# Patient Record
Sex: Female | Born: 1950 | Race: White | Hispanic: No | Marital: Married | Smoking: Never smoker
Health system: Southern US, Community
[De-identification: ages and names within clinical notes are randomized; demographics above are authoritative.]

## PROBLEM LIST (undated history)

## (undated) DIAGNOSIS — J45909 Unspecified asthma, uncomplicated: Secondary | ICD-10-CM

## (undated) DIAGNOSIS — K9041 Non-celiac gluten sensitivity: Secondary | ICD-10-CM

## (undated) DIAGNOSIS — K224 Dyskinesia of esophagus: Secondary | ICD-10-CM

## (undated) DIAGNOSIS — Z860100 Personal history of colon polyps, unspecified: Secondary | ICD-10-CM

## (undated) DIAGNOSIS — Z87828 Personal history of other (healed) physical injury and trauma: Secondary | ICD-10-CM

## (undated) DIAGNOSIS — C50919 Malignant neoplasm of unspecified site of unspecified female breast: Secondary | ICD-10-CM

## (undated) DIAGNOSIS — E041 Nontoxic single thyroid nodule: Secondary | ICD-10-CM

## (undated) DIAGNOSIS — Z87442 Personal history of urinary calculi: Secondary | ICD-10-CM

## (undated) DIAGNOSIS — K219 Gastro-esophageal reflux disease without esophagitis: Secondary | ICD-10-CM

## (undated) HISTORY — DX: Personal history of urinary calculi: Z87.442

## (undated) HISTORY — PX: CHOLECYSTECTOMY: SHX55

## (undated) HISTORY — DX: Dyskinesia of esophagus: K22.4

## (undated) HISTORY — DX: Unspecified asthma, uncomplicated: J45.909

## (undated) HISTORY — DX: Malignant neoplasm of unspecified site of unspecified female breast: C50.919

## (undated) HISTORY — DX: Nontoxic single thyroid nodule: E04.1

## (undated) HISTORY — DX: Gastro-esophageal reflux disease without esophagitis: K21.9

## (undated) HISTORY — DX: Personal history of other (healed) physical injury and trauma: Z87.828

## (undated) HISTORY — DX: Non-celiac gluten sensitivity: K90.41

## (undated) HISTORY — DX: Personal history of colon polyps, unspecified: Z86.0100

## (undated) HISTORY — PX: BREAST LUMPECTOMY WITH SENTINEL LYMPH NODE BIOPSY: SHX5597

## (undated) HISTORY — PX: SKIN GRAFT: SHX250

---

## 2015-01-09 DIAGNOSIS — Z1231 Encounter for screening mammogram for malignant neoplasm of breast: Secondary | ICD-10-CM | POA: Diagnosis not present

## 2015-01-09 DIAGNOSIS — Z1211 Encounter for screening for malignant neoplasm of colon: Secondary | ICD-10-CM | POA: Diagnosis not present

## 2015-01-09 DIAGNOSIS — Z0000 Encounter for general adult medical examination without abnormal findings: Secondary | ICD-10-CM | POA: Diagnosis not present

## 2015-01-09 DIAGNOSIS — R413 Other amnesia: Secondary | ICD-10-CM | POA: Diagnosis not present

## 2015-01-09 DIAGNOSIS — M859 Disorder of bone density and structure, unspecified: Secondary | ICD-10-CM | POA: Diagnosis not present

## 2015-01-09 DIAGNOSIS — Z136 Encounter for screening for cardiovascular disorders: Secondary | ICD-10-CM | POA: Diagnosis not present

## 2015-01-09 DIAGNOSIS — Z124 Encounter for screening for malignant neoplasm of cervix: Secondary | ICD-10-CM | POA: Diagnosis not present

## 2015-01-12 DIAGNOSIS — Z1211 Encounter for screening for malignant neoplasm of colon: Secondary | ICD-10-CM | POA: Diagnosis not present

## 2015-02-03 DIAGNOSIS — Z1231 Encounter for screening mammogram for malignant neoplasm of breast: Secondary | ICD-10-CM | POA: Diagnosis not present

## 2015-02-08 DIAGNOSIS — R4789 Other speech disturbances: Secondary | ICD-10-CM | POA: Diagnosis not present

## 2015-02-08 DIAGNOSIS — I69954 Hemiplegia and hemiparesis following unspecified cerebrovascular disease affecting left non-dominant side: Secondary | ICD-10-CM | POA: Diagnosis not present

## 2015-02-08 DIAGNOSIS — R412 Retrograde amnesia: Secondary | ICD-10-CM | POA: Diagnosis not present

## 2015-02-10 DIAGNOSIS — R4781 Slurred speech: Secondary | ICD-10-CM | POA: Diagnosis not present

## 2015-02-10 DIAGNOSIS — M8580 Other specified disorders of bone density and structure, unspecified site: Secondary | ICD-10-CM | POA: Diagnosis not present

## 2015-02-10 DIAGNOSIS — R55 Syncope and collapse: Secondary | ICD-10-CM | POA: Diagnosis not present

## 2015-02-10 DIAGNOSIS — I69954 Hemiplegia and hemiparesis following unspecified cerebrovascular disease affecting left non-dominant side: Secondary | ICD-10-CM | POA: Diagnosis not present

## 2015-02-10 DIAGNOSIS — I517 Cardiomegaly: Secondary | ICD-10-CM | POA: Diagnosis not present

## 2015-02-10 DIAGNOSIS — Z0389 Encounter for observation for other suspected diseases and conditions ruled out: Secondary | ICD-10-CM | POA: Diagnosis not present

## 2015-02-10 DIAGNOSIS — M8589 Other specified disorders of bone density and structure, multiple sites: Secondary | ICD-10-CM | POA: Diagnosis not present

## 2015-02-10 DIAGNOSIS — R4701 Aphasia: Secondary | ICD-10-CM | POA: Diagnosis not present

## 2015-02-10 DIAGNOSIS — Z13820 Encounter for screening for osteoporosis: Secondary | ICD-10-CM | POA: Diagnosis not present

## 2015-02-16 DIAGNOSIS — R42 Dizziness and giddiness: Secondary | ICD-10-CM | POA: Diagnosis not present

## 2015-02-16 DIAGNOSIS — R29898 Other symptoms and signs involving the musculoskeletal system: Secondary | ICD-10-CM | POA: Diagnosis not present

## 2015-02-16 DIAGNOSIS — Z86010 Personal history of colonic polyps: Secondary | ICD-10-CM | POA: Diagnosis not present

## 2015-02-16 DIAGNOSIS — R412 Retrograde amnesia: Secondary | ICD-10-CM | POA: Diagnosis not present

## 2015-02-16 DIAGNOSIS — R479 Unspecified speech disturbances: Secondary | ICD-10-CM | POA: Diagnosis not present

## 2015-02-16 DIAGNOSIS — Z1211 Encounter for screening for malignant neoplasm of colon: Secondary | ICD-10-CM | POA: Diagnosis not present

## 2015-02-21 DIAGNOSIS — R29898 Other symptoms and signs involving the musculoskeletal system: Secondary | ICD-10-CM | POA: Diagnosis not present

## 2015-02-21 DIAGNOSIS — S3992XA Unspecified injury of lower back, initial encounter: Secondary | ICD-10-CM | POA: Diagnosis not present

## 2015-02-21 DIAGNOSIS — M6281 Muscle weakness (generalized): Secondary | ICD-10-CM | POA: Diagnosis not present

## 2015-02-21 DIAGNOSIS — R2681 Unsteadiness on feet: Secondary | ICD-10-CM | POA: Diagnosis not present

## 2015-02-21 DIAGNOSIS — R202 Paresthesia of skin: Secondary | ICD-10-CM | POA: Diagnosis not present

## 2015-02-21 DIAGNOSIS — M545 Low back pain: Secondary | ICD-10-CM | POA: Diagnosis not present

## 2015-02-24 DIAGNOSIS — E0789 Other specified disorders of thyroid: Secondary | ICD-10-CM | POA: Diagnosis not present

## 2015-02-24 DIAGNOSIS — E042 Nontoxic multinodular goiter: Secondary | ICD-10-CM | POA: Diagnosis not present

## 2015-03-09 DIAGNOSIS — E041 Nontoxic single thyroid nodule: Secondary | ICD-10-CM | POA: Diagnosis not present

## 2015-03-20 DIAGNOSIS — Z9049 Acquired absence of other specified parts of digestive tract: Secondary | ICD-10-CM | POA: Diagnosis not present

## 2015-03-20 DIAGNOSIS — Z800 Family history of malignant neoplasm of digestive organs: Secondary | ICD-10-CM | POA: Diagnosis not present

## 2015-03-20 DIAGNOSIS — K219 Gastro-esophageal reflux disease without esophagitis: Secondary | ICD-10-CM | POA: Diagnosis not present

## 2015-03-20 DIAGNOSIS — K648 Other hemorrhoids: Secondary | ICD-10-CM | POA: Diagnosis not present

## 2015-03-20 DIAGNOSIS — K621 Rectal polyp: Secondary | ICD-10-CM | POA: Diagnosis not present

## 2015-03-20 DIAGNOSIS — K5730 Diverticulosis of large intestine without perforation or abscess without bleeding: Secondary | ICD-10-CM | POA: Diagnosis not present

## 2015-03-20 DIAGNOSIS — Z1211 Encounter for screening for malignant neoplasm of colon: Secondary | ICD-10-CM | POA: Diagnosis not present

## 2015-03-20 DIAGNOSIS — Z8669 Personal history of other diseases of the nervous system and sense organs: Secondary | ICD-10-CM | POA: Diagnosis not present

## 2015-03-20 DIAGNOSIS — J45909 Unspecified asthma, uncomplicated: Secondary | ICD-10-CM | POA: Diagnosis not present

## 2015-03-20 DIAGNOSIS — D128 Benign neoplasm of rectum: Secondary | ICD-10-CM | POA: Diagnosis not present

## 2015-03-20 DIAGNOSIS — J42 Unspecified chronic bronchitis: Secondary | ICD-10-CM | POA: Diagnosis not present

## 2015-03-20 DIAGNOSIS — Z86010 Personal history of colonic polyps: Secondary | ICD-10-CM | POA: Diagnosis not present

## 2015-03-20 DIAGNOSIS — M8580 Other specified disorders of bone density and structure, unspecified site: Secondary | ICD-10-CM | POA: Diagnosis not present

## 2015-05-02 DIAGNOSIS — N3001 Acute cystitis with hematuria: Secondary | ICD-10-CM | POA: Diagnosis not present

## 2015-09-19 DIAGNOSIS — Z23 Encounter for immunization: Secondary | ICD-10-CM | POA: Diagnosis not present

## 2016-02-19 DIAGNOSIS — Z23 Encounter for immunization: Secondary | ICD-10-CM | POA: Diagnosis not present

## 2016-02-19 DIAGNOSIS — Z0000 Encounter for general adult medical examination without abnormal findings: Secondary | ICD-10-CM | POA: Diagnosis not present

## 2016-02-19 DIAGNOSIS — Z124 Encounter for screening for malignant neoplasm of cervix: Secondary | ICD-10-CM | POA: Diagnosis not present

## 2016-02-19 DIAGNOSIS — Z1231 Encounter for screening mammogram for malignant neoplasm of breast: Secondary | ICD-10-CM | POA: Diagnosis not present

## 2016-02-19 DIAGNOSIS — M8589 Other specified disorders of bone density and structure, multiple sites: Secondary | ICD-10-CM | POA: Diagnosis not present

## 2016-02-20 DIAGNOSIS — R413 Other amnesia: Secondary | ICD-10-CM | POA: Diagnosis not present

## 2016-02-20 DIAGNOSIS — Z0000 Encounter for general adult medical examination without abnormal findings: Secondary | ICD-10-CM | POA: Diagnosis not present

## 2016-02-27 DIAGNOSIS — Z1231 Encounter for screening mammogram for malignant neoplasm of breast: Secondary | ICD-10-CM | POA: Diagnosis not present

## 2016-02-27 DIAGNOSIS — R928 Other abnormal and inconclusive findings on diagnostic imaging of breast: Secondary | ICD-10-CM | POA: Diagnosis not present

## 2016-03-11 DIAGNOSIS — N6002 Solitary cyst of left breast: Secondary | ICD-10-CM | POA: Diagnosis not present

## 2016-09-02 DIAGNOSIS — J018 Other acute sinusitis: Secondary | ICD-10-CM | POA: Diagnosis not present

## 2016-10-10 DIAGNOSIS — Z23 Encounter for immunization: Secondary | ICD-10-CM | POA: Diagnosis not present

## 2017-02-20 DIAGNOSIS — J018 Other acute sinusitis: Secondary | ICD-10-CM | POA: Diagnosis not present

## 2017-03-10 DIAGNOSIS — J018 Other acute sinusitis: Secondary | ICD-10-CM | POA: Diagnosis not present

## 2017-05-13 DIAGNOSIS — M25551 Pain in right hip: Secondary | ICD-10-CM | POA: Diagnosis not present

## 2017-05-27 DIAGNOSIS — K219 Gastro-esophageal reflux disease without esophagitis: Secondary | ICD-10-CM | POA: Diagnosis not present

## 2017-05-27 DIAGNOSIS — N950 Postmenopausal bleeding: Secondary | ICD-10-CM | POA: Diagnosis not present

## 2017-06-03 DIAGNOSIS — N950 Postmenopausal bleeding: Secondary | ICD-10-CM | POA: Diagnosis not present

## 2017-07-15 DIAGNOSIS — R42 Dizziness and giddiness: Secondary | ICD-10-CM | POA: Diagnosis not present

## 2017-07-15 DIAGNOSIS — R51 Headache: Secondary | ICD-10-CM | POA: Diagnosis not present

## 2017-07-15 DIAGNOSIS — R55 Syncope and collapse: Secondary | ICD-10-CM | POA: Diagnosis not present

## 2017-07-15 DIAGNOSIS — I951 Orthostatic hypotension: Secondary | ICD-10-CM | POA: Diagnosis not present

## 2017-07-21 DIAGNOSIS — N950 Postmenopausal bleeding: Secondary | ICD-10-CM | POA: Diagnosis not present

## 2017-07-21 DIAGNOSIS — N840 Polyp of corpus uteri: Secondary | ICD-10-CM | POA: Diagnosis not present

## 2017-07-30 DIAGNOSIS — R55 Syncope and collapse: Secondary | ICD-10-CM | POA: Diagnosis not present

## 2017-07-30 DIAGNOSIS — R51 Headache: Secondary | ICD-10-CM | POA: Diagnosis not present

## 2017-08-05 DIAGNOSIS — N950 Postmenopausal bleeding: Secondary | ICD-10-CM | POA: Diagnosis not present

## 2017-08-05 DIAGNOSIS — N840 Polyp of corpus uteri: Secondary | ICD-10-CM | POA: Diagnosis not present

## 2017-08-12 DIAGNOSIS — H25813 Combined forms of age-related cataract, bilateral: Secondary | ICD-10-CM | POA: Diagnosis not present

## 2017-11-13 DIAGNOSIS — M25551 Pain in right hip: Secondary | ICD-10-CM | POA: Diagnosis not present

## 2018-02-10 DIAGNOSIS — Z79899 Other long term (current) drug therapy: Secondary | ICD-10-CM | POA: Diagnosis not present

## 2018-02-10 DIAGNOSIS — Z0000 Encounter for general adult medical examination without abnormal findings: Secondary | ICD-10-CM | POA: Diagnosis not present

## 2018-02-10 DIAGNOSIS — Z1211 Encounter for screening for malignant neoplasm of colon: Secondary | ICD-10-CM | POA: Diagnosis not present

## 2018-02-17 DIAGNOSIS — M25551 Pain in right hip: Secondary | ICD-10-CM | POA: Diagnosis not present

## 2018-03-05 DIAGNOSIS — M25551 Pain in right hip: Secondary | ICD-10-CM | POA: Diagnosis not present

## 2018-03-05 DIAGNOSIS — M25651 Stiffness of right hip, not elsewhere classified: Secondary | ICD-10-CM | POA: Diagnosis not present

## 2018-03-05 DIAGNOSIS — R2689 Other abnormalities of gait and mobility: Secondary | ICD-10-CM | POA: Diagnosis not present

## 2018-03-09 DIAGNOSIS — M25551 Pain in right hip: Secondary | ICD-10-CM | POA: Diagnosis not present

## 2018-03-09 DIAGNOSIS — R2689 Other abnormalities of gait and mobility: Secondary | ICD-10-CM | POA: Diagnosis not present

## 2018-03-09 DIAGNOSIS — M25651 Stiffness of right hip, not elsewhere classified: Secondary | ICD-10-CM | POA: Diagnosis not present

## 2018-03-12 DIAGNOSIS — R2689 Other abnormalities of gait and mobility: Secondary | ICD-10-CM | POA: Diagnosis not present

## 2018-03-12 DIAGNOSIS — M25651 Stiffness of right hip, not elsewhere classified: Secondary | ICD-10-CM | POA: Diagnosis not present

## 2018-03-12 DIAGNOSIS — M25551 Pain in right hip: Secondary | ICD-10-CM | POA: Diagnosis not present

## 2018-03-16 DIAGNOSIS — R2689 Other abnormalities of gait and mobility: Secondary | ICD-10-CM | POA: Diagnosis not present

## 2018-03-16 DIAGNOSIS — M25651 Stiffness of right hip, not elsewhere classified: Secondary | ICD-10-CM | POA: Diagnosis not present

## 2018-03-16 DIAGNOSIS — M25551 Pain in right hip: Secondary | ICD-10-CM | POA: Diagnosis not present

## 2018-03-19 DIAGNOSIS — M25651 Stiffness of right hip, not elsewhere classified: Secondary | ICD-10-CM | POA: Diagnosis not present

## 2018-03-19 DIAGNOSIS — R2689 Other abnormalities of gait and mobility: Secondary | ICD-10-CM | POA: Diagnosis not present

## 2018-03-19 DIAGNOSIS — M25551 Pain in right hip: Secondary | ICD-10-CM | POA: Diagnosis not present

## 2018-03-23 DIAGNOSIS — M25551 Pain in right hip: Secondary | ICD-10-CM | POA: Diagnosis not present

## 2018-03-23 DIAGNOSIS — M25651 Stiffness of right hip, not elsewhere classified: Secondary | ICD-10-CM | POA: Diagnosis not present

## 2018-03-23 DIAGNOSIS — R2689 Other abnormalities of gait and mobility: Secondary | ICD-10-CM | POA: Diagnosis not present

## 2018-03-31 DIAGNOSIS — R2689 Other abnormalities of gait and mobility: Secondary | ICD-10-CM | POA: Diagnosis not present

## 2018-03-31 DIAGNOSIS — M25551 Pain in right hip: Secondary | ICD-10-CM | POA: Diagnosis not present

## 2018-03-31 DIAGNOSIS — M25651 Stiffness of right hip, not elsewhere classified: Secondary | ICD-10-CM | POA: Diagnosis not present

## 2018-04-07 DIAGNOSIS — Z9181 History of falling: Secondary | ICD-10-CM | POA: Diagnosis not present

## 2018-04-07 DIAGNOSIS — Z139 Encounter for screening, unspecified: Secondary | ICD-10-CM | POA: Diagnosis not present

## 2018-04-07 DIAGNOSIS — J302 Other seasonal allergic rhinitis: Secondary | ICD-10-CM | POA: Diagnosis not present

## 2018-04-07 DIAGNOSIS — Z1331 Encounter for screening for depression: Secondary | ICD-10-CM | POA: Diagnosis not present

## 2018-04-07 DIAGNOSIS — M799 Soft tissue disorder, unspecified: Secondary | ICD-10-CM | POA: Diagnosis not present

## 2018-04-16 DIAGNOSIS — R2241 Localized swelling, mass and lump, right lower limb: Secondary | ICD-10-CM | POA: Diagnosis not present

## 2018-04-20 DIAGNOSIS — R2241 Localized swelling, mass and lump, right lower limb: Secondary | ICD-10-CM | POA: Diagnosis not present

## 2018-04-20 DIAGNOSIS — M25551 Pain in right hip: Secondary | ICD-10-CM | POA: Diagnosis not present

## 2018-04-20 DIAGNOSIS — M7061 Trochanteric bursitis, right hip: Secondary | ICD-10-CM | POA: Diagnosis not present

## 2018-05-04 DIAGNOSIS — M25651 Stiffness of right hip, not elsewhere classified: Secondary | ICD-10-CM | POA: Diagnosis not present

## 2018-05-04 DIAGNOSIS — R2689 Other abnormalities of gait and mobility: Secondary | ICD-10-CM | POA: Diagnosis not present

## 2018-05-04 DIAGNOSIS — M25551 Pain in right hip: Secondary | ICD-10-CM | POA: Diagnosis not present

## 2018-08-24 DIAGNOSIS — H02846 Edema of left eye, unspecified eyelid: Secondary | ICD-10-CM | POA: Diagnosis not present

## 2018-09-15 DIAGNOSIS — Z23 Encounter for immunization: Secondary | ICD-10-CM | POA: Diagnosis not present

## 2018-12-15 DIAGNOSIS — N390 Urinary tract infection, site not specified: Secondary | ICD-10-CM | POA: Diagnosis not present

## 2018-12-15 DIAGNOSIS — R300 Dysuria: Secondary | ICD-10-CM | POA: Diagnosis not present

## 2019-04-27 DIAGNOSIS — Z9181 History of falling: Secondary | ICD-10-CM | POA: Diagnosis not present

## 2019-04-27 DIAGNOSIS — Z139 Encounter for screening, unspecified: Secondary | ICD-10-CM | POA: Diagnosis not present

## 2019-04-27 DIAGNOSIS — N959 Unspecified menopausal and perimenopausal disorder: Secondary | ICD-10-CM | POA: Diagnosis not present

## 2019-04-27 DIAGNOSIS — Z0000 Encounter for general adult medical examination without abnormal findings: Secondary | ICD-10-CM | POA: Diagnosis not present

## 2019-04-27 DIAGNOSIS — Z1231 Encounter for screening mammogram for malignant neoplasm of breast: Secondary | ICD-10-CM | POA: Diagnosis not present

## 2019-05-18 DIAGNOSIS — K219 Gastro-esophageal reflux disease without esophagitis: Secondary | ICD-10-CM | POA: Diagnosis not present

## 2019-05-18 DIAGNOSIS — R1310 Dysphagia, unspecified: Secondary | ICD-10-CM | POA: Diagnosis not present

## 2019-05-18 DIAGNOSIS — Z79899 Other long term (current) drug therapy: Secondary | ICD-10-CM | POA: Diagnosis not present

## 2019-05-24 DIAGNOSIS — K219 Gastro-esophageal reflux disease without esophagitis: Secondary | ICD-10-CM | POA: Diagnosis not present

## 2019-05-24 DIAGNOSIS — Z79899 Other long term (current) drug therapy: Secondary | ICD-10-CM | POA: Diagnosis not present

## 2019-06-15 DIAGNOSIS — R1310 Dysphagia, unspecified: Secondary | ICD-10-CM | POA: Diagnosis not present

## 2019-06-21 DIAGNOSIS — R1310 Dysphagia, unspecified: Secondary | ICD-10-CM | POA: Diagnosis not present

## 2019-06-21 DIAGNOSIS — K219 Gastro-esophageal reflux disease without esophagitis: Secondary | ICD-10-CM | POA: Diagnosis not present

## 2019-07-15 DIAGNOSIS — K224 Dyskinesia of esophagus: Secondary | ICD-10-CM | POA: Diagnosis not present

## 2019-07-15 DIAGNOSIS — K210 Gastro-esophageal reflux disease with esophagitis: Secondary | ICD-10-CM | POA: Diagnosis not present

## 2019-08-09 DIAGNOSIS — R509 Fever, unspecified: Secondary | ICD-10-CM | POA: Diagnosis not present

## 2019-09-24 DIAGNOSIS — Z23 Encounter for immunization: Secondary | ICD-10-CM | POA: Diagnosis not present

## 2019-12-07 DIAGNOSIS — Z1231 Encounter for screening mammogram for malignant neoplasm of breast: Secondary | ICD-10-CM | POA: Diagnosis not present

## 2019-12-31 DIAGNOSIS — N6012 Diffuse cystic mastopathy of left breast: Secondary | ICD-10-CM | POA: Diagnosis not present

## 2019-12-31 DIAGNOSIS — R922 Inconclusive mammogram: Secondary | ICD-10-CM | POA: Diagnosis not present

## 2019-12-31 DIAGNOSIS — R928 Other abnormal and inconclusive findings on diagnostic imaging of breast: Secondary | ICD-10-CM | POA: Diagnosis not present

## 2020-01-10 DIAGNOSIS — C50412 Malignant neoplasm of upper-outer quadrant of left female breast: Secondary | ICD-10-CM | POA: Diagnosis not present

## 2020-01-10 DIAGNOSIS — N6321 Unspecified lump in the left breast, upper outer quadrant: Secondary | ICD-10-CM | POA: Diagnosis not present

## 2020-01-10 DIAGNOSIS — R928 Other abnormal and inconclusive findings on diagnostic imaging of breast: Secondary | ICD-10-CM | POA: Diagnosis not present

## 2020-01-10 DIAGNOSIS — Z170 Estrogen receptor positive status [ER+]: Secondary | ICD-10-CM | POA: Diagnosis not present

## 2020-01-19 ENCOUNTER — Other Ambulatory Visit: Payer: Self-pay | Admitting: Surgery

## 2020-01-20 ENCOUNTER — Other Ambulatory Visit: Payer: Self-pay | Admitting: Surgery

## 2020-01-20 DIAGNOSIS — C50912 Malignant neoplasm of unspecified site of left female breast: Secondary | ICD-10-CM

## 2020-01-26 ENCOUNTER — Ambulatory Visit
Admission: RE | Admit: 2020-01-26 | Discharge: 2020-01-26 | Disposition: A | Discharge status: Home / Self Care | Payer: PPO | Source: Ambulatory Visit | Attending: Surgery | Admitting: Surgery

## 2020-01-26 ENCOUNTER — Other Ambulatory Visit: Payer: Self-pay

## 2020-01-26 DIAGNOSIS — C50912 Malignant neoplasm of unspecified site of left female breast: Secondary | ICD-10-CM

## 2020-01-26 DIAGNOSIS — D0502 Lobular carcinoma in situ of left breast: Secondary | ICD-10-CM | POA: Diagnosis not present

## 2020-01-26 MED ORDER — GADOBUTROL 1 MMOL/ML IV SOLN
10.0000 mL | Freq: Once | INTRAVENOUS | Status: AC | PRN
  Administered 2020-01-26: 10 mL via INTRAVENOUS

## 2020-02-01 DIAGNOSIS — C50412 Malignant neoplasm of upper-outer quadrant of left female breast: Secondary | ICD-10-CM | POA: Diagnosis not present

## 2020-02-01 DIAGNOSIS — Z170 Estrogen receptor positive status [ER+]: Secondary | ICD-10-CM | POA: Diagnosis not present

## 2020-02-02 ENCOUNTER — Other Ambulatory Visit: Payer: Self-pay

## 2020-02-02 DIAGNOSIS — Z1159 Encounter for screening for other viral diseases: Secondary | ICD-10-CM | POA: Diagnosis not present

## 2020-02-07 DIAGNOSIS — Z86010 Personal history of colonic polyps: Secondary | ICD-10-CM | POA: Diagnosis not present

## 2020-02-07 DIAGNOSIS — N6321 Unspecified lump in the left breast, upper outer quadrant: Secondary | ICD-10-CM | POA: Diagnosis not present

## 2020-02-07 DIAGNOSIS — Z8719 Personal history of other diseases of the digestive system: Secondary | ICD-10-CM | POA: Diagnosis not present

## 2020-02-07 DIAGNOSIS — Z170 Estrogen receptor positive status [ER+]: Secondary | ICD-10-CM | POA: Diagnosis not present

## 2020-02-07 DIAGNOSIS — C50412 Malignant neoplasm of upper-outer quadrant of left female breast: Secondary | ICD-10-CM | POA: Diagnosis not present

## 2020-02-07 DIAGNOSIS — J45909 Unspecified asthma, uncomplicated: Secondary | ICD-10-CM | POA: Diagnosis not present

## 2020-02-07 DIAGNOSIS — C50912 Malignant neoplasm of unspecified site of left female breast: Secondary | ICD-10-CM | POA: Diagnosis not present

## 2020-02-07 DIAGNOSIS — Z87891 Personal history of nicotine dependence: Secondary | ICD-10-CM | POA: Diagnosis not present

## 2020-02-07 DIAGNOSIS — E669 Obesity, unspecified: Secondary | ICD-10-CM | POA: Diagnosis not present

## 2020-02-07 DIAGNOSIS — Z79899 Other long term (current) drug therapy: Secondary | ICD-10-CM | POA: Diagnosis not present

## 2020-02-15 DIAGNOSIS — C50412 Malignant neoplasm of upper-outer quadrant of left female breast: Secondary | ICD-10-CM | POA: Diagnosis not present

## 2020-02-15 DIAGNOSIS — Z170 Estrogen receptor positive status [ER+]: Secondary | ICD-10-CM | POA: Diagnosis not present

## 2020-02-21 DIAGNOSIS — C50412 Malignant neoplasm of upper-outer quadrant of left female breast: Secondary | ICD-10-CM | POA: Diagnosis not present

## 2020-02-22 DIAGNOSIS — C50412 Malignant neoplasm of upper-outer quadrant of left female breast: Secondary | ICD-10-CM | POA: Diagnosis not present

## 2020-02-22 DIAGNOSIS — Z170 Estrogen receptor positive status [ER+]: Secondary | ICD-10-CM | POA: Diagnosis not present

## 2020-03-06 DIAGNOSIS — J329 Chronic sinusitis, unspecified: Secondary | ICD-10-CM | POA: Diagnosis not present

## 2020-03-06 DIAGNOSIS — Z6834 Body mass index (BMI) 34.0-34.9, adult: Secondary | ICD-10-CM | POA: Diagnosis not present

## 2020-03-06 DIAGNOSIS — Z131 Encounter for screening for diabetes mellitus: Secondary | ICD-10-CM | POA: Diagnosis not present

## 2020-03-06 DIAGNOSIS — E538 Deficiency of other specified B group vitamins: Secondary | ICD-10-CM | POA: Diagnosis not present

## 2020-03-06 DIAGNOSIS — G2581 Restless legs syndrome: Secondary | ICD-10-CM | POA: Diagnosis not present

## 2020-03-06 DIAGNOSIS — E041 Nontoxic single thyroid nodule: Secondary | ICD-10-CM | POA: Diagnosis not present

## 2020-03-06 DIAGNOSIS — N181 Chronic kidney disease, stage 1: Secondary | ICD-10-CM | POA: Diagnosis not present

## 2020-03-06 DIAGNOSIS — Z13220 Encounter for screening for lipoid disorders: Secondary | ICD-10-CM | POA: Diagnosis not present

## 2020-03-07 DIAGNOSIS — Z170 Estrogen receptor positive status [ER+]: Secondary | ICD-10-CM | POA: Diagnosis not present

## 2020-03-07 DIAGNOSIS — C50412 Malignant neoplasm of upper-outer quadrant of left female breast: Secondary | ICD-10-CM | POA: Diagnosis not present

## 2020-03-08 DIAGNOSIS — M85852 Other specified disorders of bone density and structure, left thigh: Secondary | ICD-10-CM | POA: Diagnosis not present

## 2020-03-08 DIAGNOSIS — M8589 Other specified disorders of bone density and structure, multiple sites: Secondary | ICD-10-CM | POA: Diagnosis not present

## 2020-03-13 DIAGNOSIS — E041 Nontoxic single thyroid nodule: Secondary | ICD-10-CM | POA: Diagnosis not present

## 2020-03-13 DIAGNOSIS — E042 Nontoxic multinodular goiter: Secondary | ICD-10-CM | POA: Diagnosis not present

## 2020-03-29 IMAGING — MR MR BREAST BILAT WO/W CM
8 of 12 series · 33 of 48 positions shown · IV contrast (gadavist)
Comparison: Mammography December 31, 2019 and December 07, 2019

CLINICAL DATA: Newly diagnosed lobular breast cancer in the left
breast. LCIS was identified at the biopsy is well.

LABS:  Creatinine 0.9.  GFR 65.
EXAM:
BILATERAL BREAST MRI WITH AND WITHOUT CONTRAST
TECHNIQUE: Multiplanar, multisequence MR images of both breasts were obtained
prior to and following the intravenous administration of 10 ml of
Gadavist

[Series 2: t2_tirm_tra ipat (a-p) · axial · 3.0mm · 0.74mm/px · 1 of 55 slices shown]
[im 1/55]
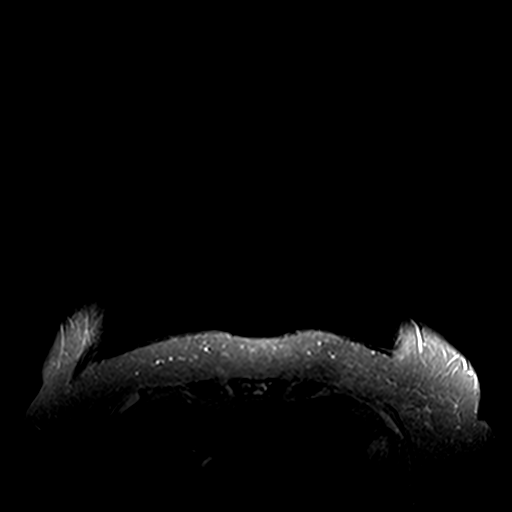

[Series 3: fl3d pre-cm no · axial · non-contrast · 1.2mm · 0.99mm/px · z∈[-50,+121]mm · 5 of 144 slices shown]
[im 1/144]
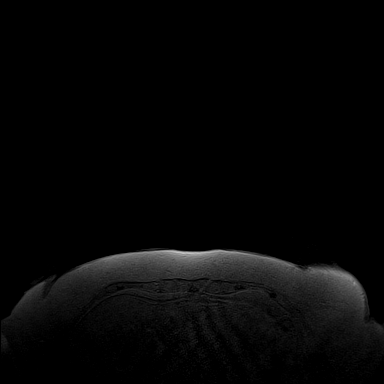
[im 36/144]
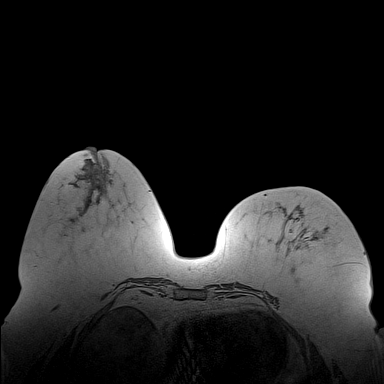
[im 72/144]
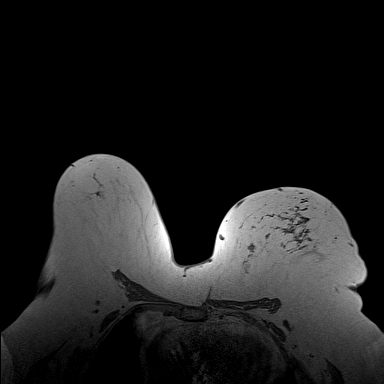
[im 108/144]
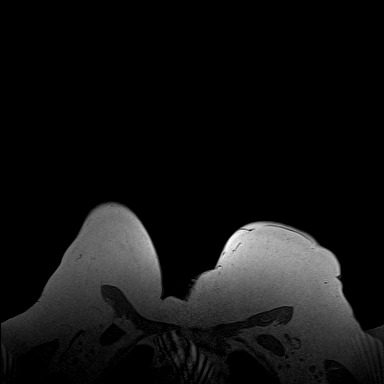
[im 144/144]
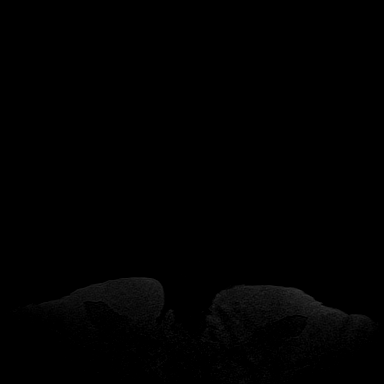

[Series 5: fl3d pre-cm · axial · non-contrast · 1.2mm · 0.99mm/px · z∈[-50,+121]mm · 5 of 144 slices shown]
[im 1/144]
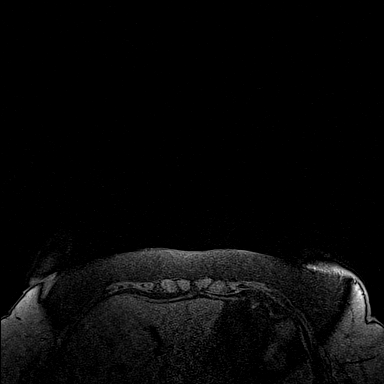
[im 36/144]
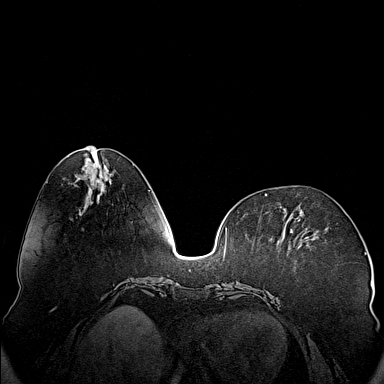
[im 72/144]
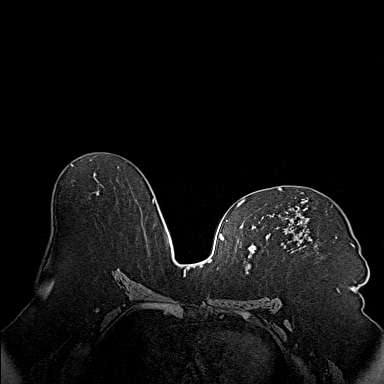
[im 108/144]
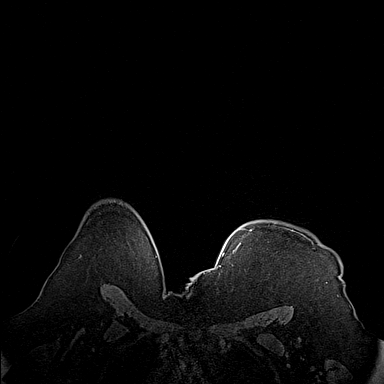
[im 144/144]
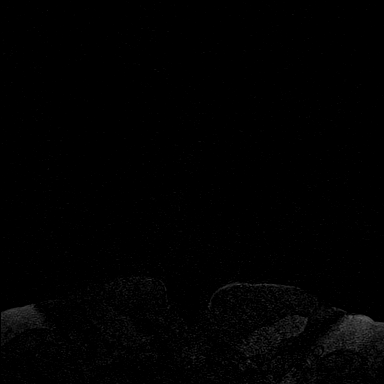

[Series 6: fl3d post-cm 20 · axial · 1.2mm · 0.99mm/px · z∈[-50,+121]mm · 5 of 144 slices shown (1 of 3)]
[im 1/144]
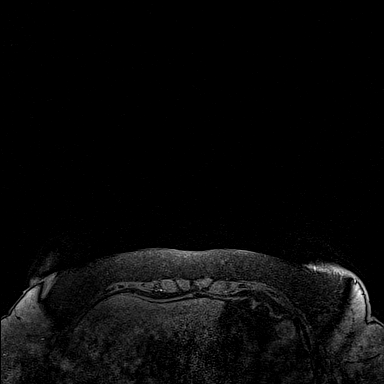
[im 36/144]
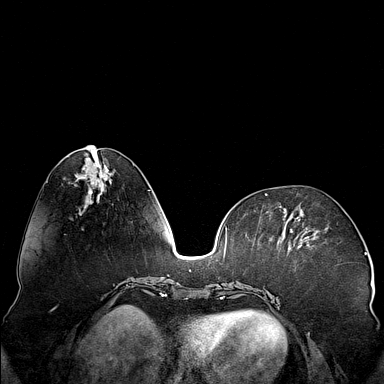
[im 72/144]
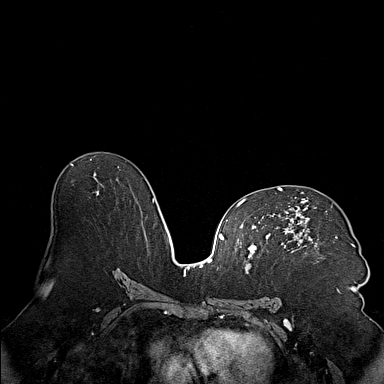
[im 108/144]
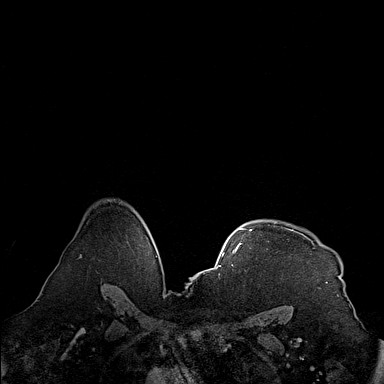
[im 144/144]
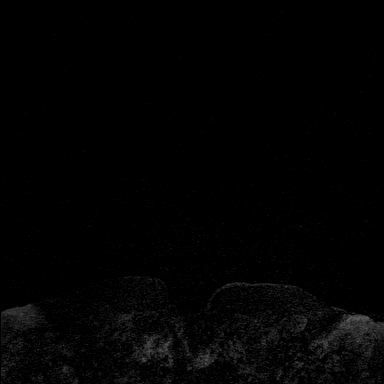

[Series 7: fl3d post-cm 20 · axial · 1.2mm · 0.99mm/px · z∈[-50,+121]mm · 5 of 144 slices shown (2 of 3)]
[im 1/144]
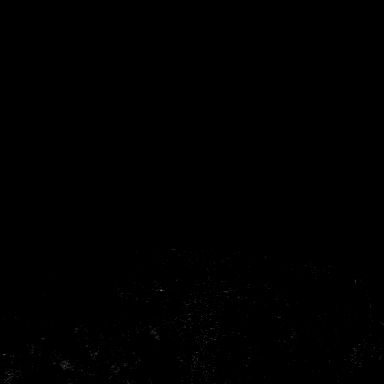
[im 36/144]
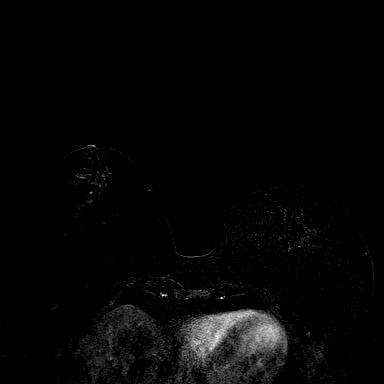
[im 72/144]
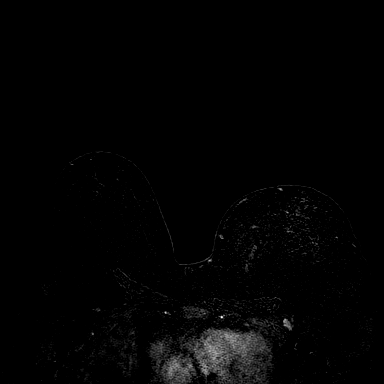
[im 108/144]
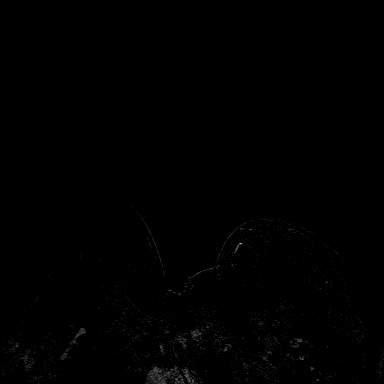
[im 144/144]
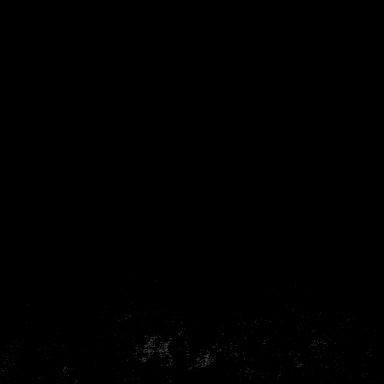

[Series 8: fl3d post-cm 20 · axial · 172.8mm · 0.99mm/px · 1 of 1 slices shown (3 of 3)]
[im 1/1]
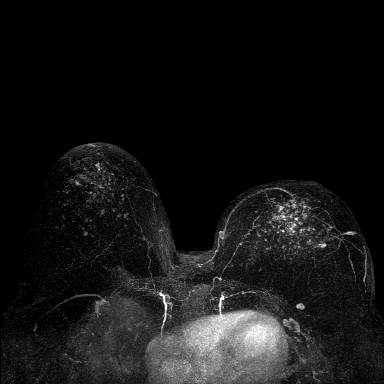

[Series 9: fl3d post-cm 3min · axial · 1.2mm · 0.99mm/px · z∈[-50,+121]mm · 6 of 144 slices shown]
[im 1/144]
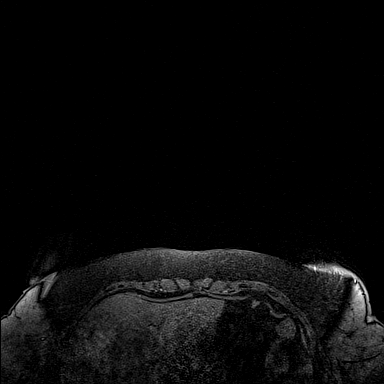
[im 29/144]
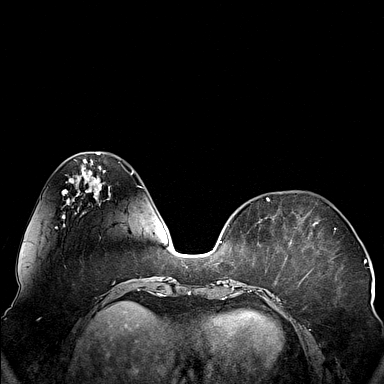
[im 58/144]
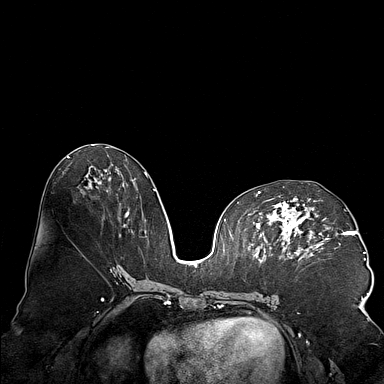
[im 86/144]
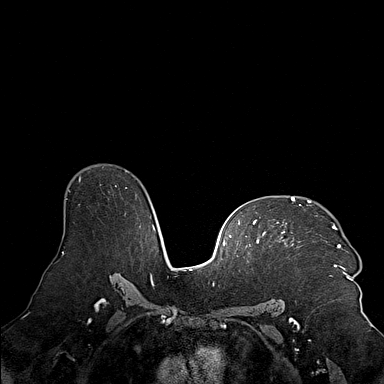
[im 115/144]
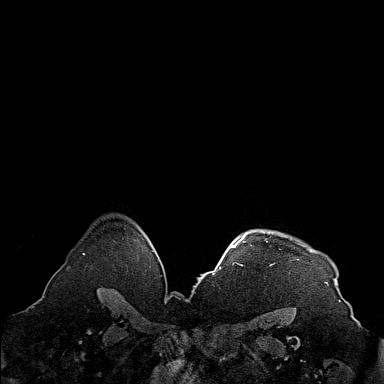
[im 144/144]
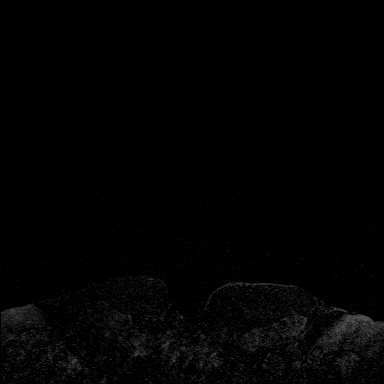

[Series 10: fl3d post-cm 3min_sub · axial · 1.2mm · 0.99mm/px · z∈[-50,+87]mm · 5 of 144 slices shown]
[im 1/144]
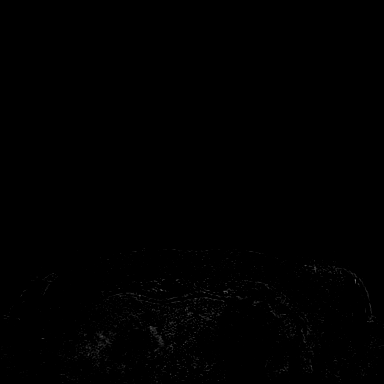
[im 29/144]
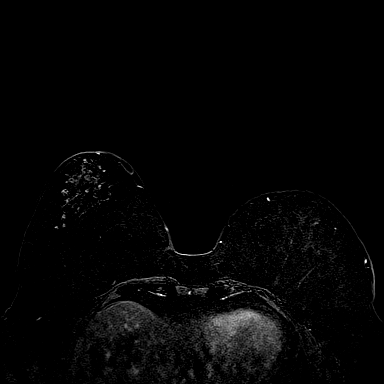
[im 58/144]
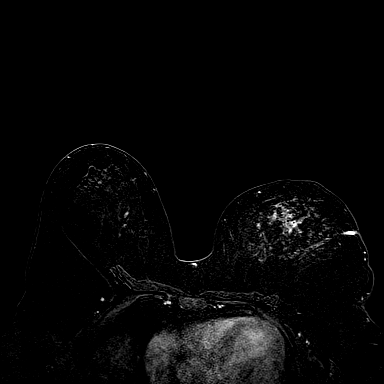
[im 86/144]
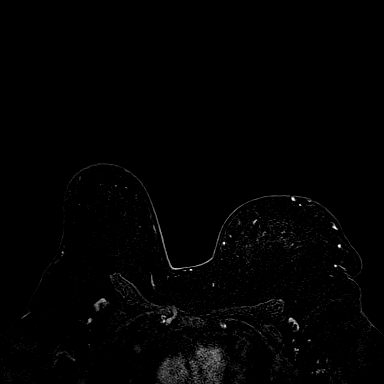
[im 115/144]
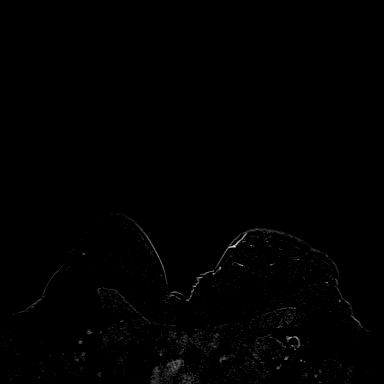

[33 of 48 positions shown; findings below may reference images not displayed]

Three-dimensional MR images were rendered by post-processing of the
original MR data on an independent workstation. The
three-dimensional MR images were interpreted, and findings are
reported in the following complete MRI report for this study. Three
dimensional images were evaluated at the independent [employer]
workstation
FINDINGS: Breast composition: c. Heterogeneous fibroglandular tissue.

Background parenchymal enhancement: Marked

Right breast: No mass or abnormal enhancement.

Left breast: The patient's known malignancy is seen in the upper
outer left breast measuring 8 x 6 x 5 mm with a mixed enhancement
kinetics including persistent, plateau, and washout [place]. No
other suspicious masses are seen in the left breast.

Lymph nodes: No abnormal appearing lymph nodes.

Ancillary findings:  None.
IMPRESSION: The patient's known malignancy in the left breast is seen in the
upper outer quadrant measuring 8 x 6 x 5 mm. There is an associated
biopsy clip. No other abnormalities are identified in either breast.
There is marked background enhancement with somewhat limits
evaluation.

RECOMMENDATION:
Recommend continued surgical consultation.

BI-RADS CATEGORY  6: Known biopsy-proven malignancy.

## 2020-04-12 DIAGNOSIS — Z01419 Encounter for gynecological examination (general) (routine) without abnormal findings: Secondary | ICD-10-CM | POA: Diagnosis not present

## 2020-04-26 DIAGNOSIS — Z780 Asymptomatic menopausal state: Secondary | ICD-10-CM | POA: Insufficient documentation

## 2020-04-26 DIAGNOSIS — M8580 Other specified disorders of bone density and structure, unspecified site: Secondary | ICD-10-CM | POA: Insufficient documentation

## 2020-05-04 DIAGNOSIS — Z1231 Encounter for screening mammogram for malignant neoplasm of breast: Secondary | ICD-10-CM | POA: Diagnosis not present

## 2020-05-04 DIAGNOSIS — Z1331 Encounter for screening for depression: Secondary | ICD-10-CM | POA: Diagnosis not present

## 2020-05-04 DIAGNOSIS — E785 Hyperlipidemia, unspecified: Secondary | ICD-10-CM | POA: Diagnosis not present

## 2020-05-04 DIAGNOSIS — Z1211 Encounter for screening for malignant neoplasm of colon: Secondary | ICD-10-CM | POA: Diagnosis not present

## 2020-05-04 DIAGNOSIS — Z0000 Encounter for general adult medical examination without abnormal findings: Secondary | ICD-10-CM | POA: Diagnosis not present

## 2020-05-04 DIAGNOSIS — Z9181 History of falling: Secondary | ICD-10-CM | POA: Diagnosis not present

## 2020-05-04 DIAGNOSIS — Z6835 Body mass index (BMI) 35.0-35.9, adult: Secondary | ICD-10-CM | POA: Diagnosis not present

## 2020-05-04 DIAGNOSIS — Z139 Encounter for screening, unspecified: Secondary | ICD-10-CM | POA: Diagnosis not present

## 2020-05-23 DIAGNOSIS — M8589 Other specified disorders of bone density and structure, multiple sites: Secondary | ICD-10-CM | POA: Diagnosis not present

## 2020-05-23 DIAGNOSIS — Z79810 Long term (current) use of selective estrogen receptor modulators (SERMs): Secondary | ICD-10-CM | POA: Diagnosis not present

## 2020-05-23 DIAGNOSIS — C50412 Malignant neoplasm of upper-outer quadrant of left female breast: Secondary | ICD-10-CM | POA: Diagnosis not present

## 2020-05-23 DIAGNOSIS — Z170 Estrogen receptor positive status [ER+]: Secondary | ICD-10-CM | POA: Diagnosis not present

## 2020-05-31 DIAGNOSIS — T7840XA Allergy, unspecified, initial encounter: Secondary | ICD-10-CM | POA: Diagnosis not present

## 2020-05-31 DIAGNOSIS — R21 Rash and other nonspecific skin eruption: Secondary | ICD-10-CM | POA: Diagnosis not present

## 2020-05-31 DIAGNOSIS — B029 Zoster without complications: Secondary | ICD-10-CM | POA: Diagnosis not present

## 2020-05-31 DIAGNOSIS — Z6835 Body mass index (BMI) 35.0-35.9, adult: Secondary | ICD-10-CM | POA: Diagnosis not present

## 2020-05-31 DIAGNOSIS — R220 Localized swelling, mass and lump, head: Secondary | ICD-10-CM | POA: Diagnosis not present

## 2020-08-24 DIAGNOSIS — Z170 Estrogen receptor positive status [ER+]: Secondary | ICD-10-CM | POA: Diagnosis not present

## 2020-08-24 DIAGNOSIS — Z79810 Long term (current) use of selective estrogen receptor modulators (SERMs): Secondary | ICD-10-CM | POA: Diagnosis not present

## 2020-08-24 DIAGNOSIS — C50412 Malignant neoplasm of upper-outer quadrant of left female breast: Secondary | ICD-10-CM | POA: Diagnosis not present

## 2020-08-24 DIAGNOSIS — M8589 Other specified disorders of bone density and structure, multiple sites: Secondary | ICD-10-CM | POA: Diagnosis not present

## 2020-08-24 DIAGNOSIS — R600 Localized edema: Secondary | ICD-10-CM | POA: Diagnosis not present

## 2020-08-31 DIAGNOSIS — C50412 Malignant neoplasm of upper-outer quadrant of left female breast: Secondary | ICD-10-CM | POA: Diagnosis not present

## 2020-08-31 DIAGNOSIS — Z170 Estrogen receptor positive status [ER+]: Secondary | ICD-10-CM | POA: Diagnosis not present

## 2020-10-18 DIAGNOSIS — R030 Elevated blood-pressure reading, without diagnosis of hypertension: Secondary | ICD-10-CM | POA: Diagnosis not present

## 2020-10-18 DIAGNOSIS — M79606 Pain in leg, unspecified: Secondary | ICD-10-CM | POA: Diagnosis not present

## 2020-10-18 DIAGNOSIS — M25562 Pain in left knee: Secondary | ICD-10-CM | POA: Diagnosis not present

## 2020-10-18 DIAGNOSIS — R609 Edema, unspecified: Secondary | ICD-10-CM | POA: Diagnosis not present

## 2020-10-18 DIAGNOSIS — M79662 Pain in left lower leg: Secondary | ICD-10-CM | POA: Diagnosis not present

## 2020-10-18 DIAGNOSIS — Z853 Personal history of malignant neoplasm of breast: Secondary | ICD-10-CM | POA: Diagnosis not present

## 2020-10-18 DIAGNOSIS — Z6835 Body mass index (BMI) 35.0-35.9, adult: Secondary | ICD-10-CM | POA: Diagnosis not present

## 2020-10-18 DIAGNOSIS — R011 Cardiac murmur, unspecified: Secondary | ICD-10-CM | POA: Diagnosis not present

## 2020-10-18 DIAGNOSIS — R600 Localized edema: Secondary | ICD-10-CM | POA: Diagnosis not present

## 2020-10-19 DIAGNOSIS — M25562 Pain in left knee: Secondary | ICD-10-CM | POA: Diagnosis not present

## 2020-11-20 NOTE — Progress Notes (Signed)
[facility]  [address] [place],  [place]  [address] [phone]  Clinic Day:  11/21/2020  Referring physician: No ref. provider found   CHIEF COMPLAINT:  CC: A 69 year old woman with a history of stage IA invasive lobular carcinoma of the left breast, diagnosed January 2021.   Current Treatment:  Observation   HISTORY OF PRESENT ILLNESS:  Jeneal Vogl is a 69 y.o. female with a history of  stage IA invasive lobular carcinoma of the left breast, diagnosed January 2021.  This began when she went in for annual screening mammogram on December 22nd 2020 which revealed possible masses in the left breast.  Diagnostic unilateral left mammogram and ultrasound from January 15th showed a 0.5 x 0.8 x 0.5 cm hypoechoic irregular mass with indistinct margins at 2 o'clock in the left breast.  At 10 o'clock a cluster of dilated ducts/cysts measuring 2.2 x 0.9 x 1.2 cm corresponds to the possible mass in the medial left breast.  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  Estrogen receptor was positive at 90% and progesterone receptor was positive at 95%.  HER2 was negative 1+ and Ki67 was 10%.  MRI from February 10th revealed the patient's known malignancy in the left breast is seen in the upper outer quadrant measuring 8 x 6 x 5 mm.  No other abnormalities identified in either breast.  On February 22nd, Dr. Lilia Pro performed left breast lumpectomy and left axillary sentinel lymph node biopsy.  Surgical pathology from this procedure confirmed invasive lobular carcinoma, grade 2, 6 mm, and lobular carcinoma in situ.  Margins were free of neoplasm.  One sentinel lymph node was negative for metastatic carcinoma (0/1), staging this as a T1bN0M0.  She does undergo routine annual mammograms.  She was  seen by Dr. Orlene Erm of radiation oncology, and she did not require adjuvant radiation.  She started menarche at age 80, and had 1 child at the age of 57.  She started menopause in her late 70's and was not placed on hormones.    She was placed on anastrozole daily in March, and reported headaches, sweats, flushing, facial swelling, arthralgias, mood swings, insomnia and feelings of near syncope.  We therefore discontinued this medication, and switched her to tamoxifen 20 mg daily.  Bone density scan from March 24th showing osteopenia with a T-score of -1.3 of the left femur neck, previously -0.9.  Dual femur total mean measures -0.8, previously -0.4, which is considered normal.  AP spine measures -1.0, previously -1.1, and is considered normal.   Tamoxifen was stopped due to continued toxicities. Left lower extremity ultrasound for swelling was negative for DVT.   INTERVAL HISTORY:  Rhyan is here today for routine follow up of her stage IA invasive lobular carcinoma.She continues to feel well off of her tamoxifen. She reports some continued edema to her left lower extremity. Her PCP is managing referrals.  She denies fevers or chills. She  denies pain. Her appetite is good. Her weight has been stable.  REVIEW OF SYSTEMS:  Review of Systems  Constitutional: Negative for appetite change, chills, diaphoresis, fatigue, fever and unexpected weight change.  HENT:   Negative for hearing loss, lump/mass, mouth sores, nosebleeds, sore throat, tinnitus, trouble swallowing and voice change.   Eyes: Negative for eye problems and icterus.  Respiratory: Negative for chest  tightness, cough, hemoptysis, shortness of breath and wheezing.   Cardiovascular: Negative for chest pain, leg swelling and palpitations.  Gastrointestinal: Negative for abdominal distention, abdominal pain, blood in stool, constipation, diarrhea, nausea, rectal pain and vomiting.  Endocrine: Negative for hot flashes.  Genitourinary: Negative  for bladder incontinence, difficulty urinating, dyspareunia, dysuria, frequency, hematuria, menstrual problem, nocturia, pelvic pain, vaginal bleeding and vaginal discharge.   Musculoskeletal: Negative for arthralgias, back pain, flank pain, gait problem, myalgias, neck pain and neck stiffness.  Skin: Negative for itching, rash and wound.  Neurological: Negative for dizziness, extremity weakness, gait problem, headaches, light-headedness, numbness, seizures and speech difficulty.  Hematological: Negative for adenopathy. Does not bruise/bleed easily.  Psychiatric/Behavioral: Negative for confusion, decreased concentration, depression, sleep disturbance and suicidal ideas. The patient is not nervous/anxious.      VITALS:  Blood pressure (!) 176/81, pulse 87, temperature 97.8 F (36.6 C), temperature source Oral, resp. rate 18, height 5' 4.5" (1.638 m), weight 220 lb 1.6 oz (99.8 kg), SpO2 96 %.  Wt Readings from Last 3 Encounters:  11/21/20 220 lb 1.6 oz (99.8 kg)    Body mass index is 37.2 kg/m.  Performance status (ECOG): 1 - Symptomatic but completely ambulatory  PHYSICAL EXAM:  Physical Exam Constitutional:      General: She is not in acute distress.    Appearance: Normal appearance. She is normal weight. She is not ill-appearing, toxic-appearing or diaphoretic.  HENT:     Head: Normocephalic and atraumatic.     Right Ear: Tympanic membrane, ear canal and external ear normal. There is no impacted cerumen.     Left Ear: Tympanic membrane, ear canal and external ear normal. There is no impacted cerumen.     Nose: Nose normal. No congestion or rhinorrhea.     Mouth/Throat:     Mouth: Mucous membranes are moist.     Pharynx: No oropharyngeal exudate or posterior oropharyngeal erythema.  Eyes:     General: No scleral icterus.       Right eye: No discharge.        Left eye: No discharge.     Extraocular Movements: Extraocular movements intact.     Conjunctiva/sclera: Conjunctivae  normal.     Pupils: Pupils are equal, round, and reactive to light.  Neck:     Vascular: No carotid bruit.  Cardiovascular:     Rate and Rhythm: Normal rate and regular rhythm.     Pulses: Normal pulses.     Heart sounds: No murmur heard.  No friction rub. No gallop.   Pulmonary:     Effort: Pulmonary effort is normal. No respiratory distress.     Breath sounds: Normal breath sounds. No stridor. No wheezing, rhonchi or rales.  Chest:     Chest wall: No tenderness.  Abdominal:     General: Abdomen is flat. Bowel sounds are normal. There is no distension.     Palpations: Abdomen is soft. There is no mass.     Tenderness: There is no abdominal tenderness. There is no right CVA tenderness, left CVA tenderness, guarding or rebound.     Hernia: No hernia is present.  Musculoskeletal:        General: No swelling, tenderness, deformity or signs of injury. Normal range of motion.     Cervical back: Normal range of motion and neck supple. No rigidity or tenderness.     Right lower leg: No edema.     Left lower leg: No edema.  Lymphadenopathy:  Cervical: No cervical adenopathy.  Skin:    General: Skin is warm and dry.     Capillary Refill: Capillary refill takes less than 2 seconds.     Coloration: Skin is not jaundiced or pale.     Findings: No bruising, erythema, lesion or rash.  Neurological:     General: No focal deficit present.     Mental Status: She is alert and oriented to person, place, and time. Mental status is at baseline.     Cranial Nerves: No cranial nerve deficit.     Sensory: No sensory deficit.     Motor: No weakness.     Coordination: Coordination normal.     Gait: Gait normal.     Deep Tendon Reflexes: Reflexes normal.  Psychiatric:        Mood and Affect: Mood normal.        Behavior: Behavior normal.        Thought Content: Thought content normal.        Judgment: Judgment normal.    Lymph nodes:   There is no cervical, clavicular, axillary or inguinal  lymphadenopathy.   LABS:  No flowsheet data found. No flowsheet data found.   No results found for: CEA1 / No results found for: CEA1 No results found for: PSA1 No results found for: ACZ660 No results found for: CAN125  No results found for: TOTALPROTELP, ALBUMINELP, A1GS, A2GS, BETS, BETA2SER, GAMS, MSPIKE, SPEI No results found for: TIBC, FERRITIN, IRONPCTSAT No results found for: LDH  STUDIES:  No results found.    HISTORY:  No past medical history on file.    No family history on file.  Social History:  reports that she has never smoked. She has never used smokeless tobacco. No history on file for alcohol use and drug use.The patient is alone  today.  Allergies: Not on File  Current Medications: No current outpatient medications on file.   No current facility-administered medications for this visit.     ASSESSMENT & PLAN:   Assessment/ Plan:  Lanah Steines is a 69 y.o. female    1.  Stage IA (T1bN0M0) invasive lobular carcinoma of the left breast, treated with lumpectomy.  HER2 was negative.  Estrogen and progesterone receptors were highly positive so she was placed on anastrozole 1 mg daily in March, and experienced multiple toxicities.  She was then switched to tamoxifen 20 mg daily in August, but has not tolerated that either. She remains off of tamoxifen without difficulties. 2.  Osteopenia, mild.  Her last bone density scan from March 2021 showed the spine was now normal, but the right femur is now considered osteopenic. 3.  Mild unilateral edema of the right leg. PCP is managing referrals.  She is doing well since stopping tamoxifen and wishes to remain without it as she understands her risk of recurrence is low. She will require continued follow up and she is agreeable to this. Her PCP is assisting to determine the cause of her lower leg edema for which she states this is improved. We will have her return to clinic in 3 months for repeat evaluation. She  verbalizes understanding of and agrees with the plans discussed today. She knows to call the office should any new questions or concerns arise.   The patient understands the plans discussed today and is in agreement with them.    The patient knows to contact our office if She develops concerns prior to her next appointment.   I provided 20  minutes of face-to-face time during this this encounter and > 50% was spent counseling as documented under my assessment and plan.    Melodye Ped, NP

## 2020-11-21 ENCOUNTER — Encounter: Payer: Self-pay | Admitting: Hematology and Oncology

## 2020-11-21 ENCOUNTER — Inpatient Hospital Stay: Payer: PPO | Attending: Hematology and Oncology | Admitting: Hematology and Oncology

## 2020-11-21 ENCOUNTER — Other Ambulatory Visit: Payer: Self-pay

## 2020-11-21 VITALS — BP 176/81 | HR 87 | Temp 97.8°F | Resp 18 | Ht 64.5 in | Wt 220.1 lb

## 2020-11-21 DIAGNOSIS — C50912 Malignant neoplasm of unspecified site of left female breast: Secondary | ICD-10-CM | POA: Diagnosis not present

## 2020-11-24 DIAGNOSIS — H25811 Combined forms of age-related cataract, right eye: Secondary | ICD-10-CM | POA: Diagnosis not present

## 2020-11-24 DIAGNOSIS — Z01818 Encounter for other preprocedural examination: Secondary | ICD-10-CM | POA: Diagnosis not present

## 2021-02-20 ENCOUNTER — Telehealth: Payer: Self-pay | Admitting: Hematology and Oncology

## 2021-02-20 ENCOUNTER — Encounter: Payer: Self-pay | Admitting: Hematology and Oncology

## 2021-02-20 ENCOUNTER — Other Ambulatory Visit: Payer: Self-pay

## 2021-02-20 ENCOUNTER — Ambulatory Visit: Payer: PPO | Admitting: Oncology

## 2021-02-20 ENCOUNTER — Inpatient Hospital Stay: Payer: Medicare Other | Attending: Hematology and Oncology | Admitting: Hematology and Oncology

## 2021-02-20 VITALS — BP 175/84 | HR 75 | Temp 97.9°F | Resp 18 | Ht 64.5 in | Wt 214.8 lb

## 2021-02-20 DIAGNOSIS — C50912 Malignant neoplasm of unspecified site of left female breast: Secondary | ICD-10-CM

## 2021-02-20 NOTE — Telephone Encounter (Signed)
Per 3/8 LOS, patient schedule for June Appt's.  Gave patient Appt Summary

## 2021-02-20 NOTE — Progress Notes (Signed)
[facility]  [address] [place],  [place]  [address] [phone]  Clinic Day:  02/20/2021  Referring physician: Renaldo Reel, PA   CHIEF COMPLAINT:  CC: A 70 year old woman with a history of stage IA invasive lobular carcinoma of the left breast, diagnosed January 2021 here for 3 month evaluation.  Current Treatment:  Observation   HISTORY OF PRESENT ILLNESS:  Jodi Soto is a 70 y.o. female with a history of  stage IA invasive lobular carcinoma of the left breast, diagnosed January 2021.  This began when she went in for annual screening mammogram on December 22nd 2020 which revealed possible masses in the left breast.  Diagnostic unilateral left mammogram and ultrasound from January 15th showed a 0.5 x 0.8 x 0.5 cm hypoechoic irregular mass with indistinct margins at 2 o'clock in the left breast.  At 10 o'clock a cluster of dilated ducts/cysts measuring 2.2 x 0.9 x 1.2 cm corresponds to the possible mass in the medial left breast.  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  Estrogen receptor was positive at 90% and progesterone receptor was positive at 95%.  HER2 was negative 1+ and Ki67 was 10%.  MRI from February 10th revealed the patient's known malignancy in the left breast is seen in the upper outer quadrant measuring 8 x 6 x 5 mm.  No other abnormalities identified in either breast.  On February 22nd, Dr. Lilia Pro performed left breast lumpectomy and left axillary sentinel lymph node biopsy.  Surgical pathology from this procedure confirmed invasive lobular carcinoma, grade 2, 6 mm, and lobular carcinoma in situ.  Margins were free of neoplasm.  One sentinel lymph node was negative for metastatic carcinoma (0/1), staging this as a T1bN0M0.  She does undergo routine annual  mammograms.  She was seen by Dr. Orlene Erm of radiation oncology, and she did not require adjuvant radiation.  She started menarche at age 55, and had 1 child at the age of 23.  She started menopause in her late 60's and was not placed on hormones.    She was placed on anastrozole daily in March, and reported headaches, sweats, flushing, facial swelling, arthralgias, mood swings, insomnia and feelings of near syncope.  We therefore discontinued this medication, and switched her to tamoxifen 20 mg daily.  Bone density scan from March 24th showing osteopenia with a T-score of -1.3 of the left femur neck, previously -0.9.  Dual femur total mean measures -0.8, previously -0.4, which is considered normal.  AP spine measures -1.0, previously -1.1, and is considered normal.   Tamoxifen was stopped due to continued toxicities. Left lower extremity ultrasound for swelling was negative for DVT.   INTERVAL HISTORY:  Jodi Soto is here today for evaluation of her stage IA invasive lobular carcinoma.She continues to feel well off of her tamoxifen. She reports some continued edema to her left lower extremity. Her PCP is managing referrals. [place]. She denies fever, chills, nausea or vomiting. She denies issue with bowel or bladder. She denies shortness of breath, cough or chest pain. She has no lingering toxicities from Tamoxifen.  REVIEW OF SYSTEMS:  Review of Systems  Constitutional: Negative for appetite change, chills, diaphoresis, fatigue, fever and unexpected weight change.  HENT:   Negative for  hearing loss, lump/mass, mouth sores, nosebleeds, sore throat, tinnitus, trouble swallowing and voice change.   Eyes: Negative for eye problems and icterus.  Respiratory: Negative for chest tightness, cough, hemoptysis, shortness of breath and wheezing.   Cardiovascular: Positive for leg swelling. Negative for chest pain and palpitations.   Gastrointestinal: Negative for abdominal distention, abdominal pain, blood in stool, constipation, diarrhea, nausea, rectal pain and vomiting.  Endocrine: Negative for hot flashes.  Genitourinary: Negative for bladder incontinence, difficulty urinating, dyspareunia, dysuria, frequency, hematuria and nocturia.   Musculoskeletal: Negative for arthralgias, back pain, flank pain, gait problem, myalgias, neck pain and neck stiffness.  Skin: Negative for itching, rash and wound.  Neurological: Negative for dizziness, extremity weakness, gait problem, headaches, light-headedness, numbness, seizures and speech difficulty.  Hematological: Negative for adenopathy. Does not bruise/bleed easily.  Psychiatric/Behavioral: Negative for confusion, decreased concentration, depression, sleep disturbance and suicidal ideas. The patient is not nervous/anxious.      VITALS:  Blood pressure (!) 175/84, pulse 75, temperature 97.9 F (36.6 C), temperature source Oral, resp. rate 18, height 5' 4.5" (1.638 m), weight 214 lb 12.8 oz (97.4 kg), SpO2 98 %.  Wt Readings from Last 3 Encounters:  02/20/21 214 lb 12.8 oz (97.4 kg)  11/21/20 220 lb 1.6 oz (99.8 kg)    Body mass index is 36.3 kg/m.  Performance status (ECOG): 1 - Symptomatic but completely ambulatory  PHYSICAL EXAM:  Physical Exam Constitutional:      General: She is not in acute distress.    Appearance: Normal appearance. She is normal weight. She is not ill-appearing, toxic-appearing or diaphoretic.  HENT:     Head: Normocephalic and atraumatic.     Nose: Nose normal. No congestion or rhinorrhea.     Mouth/Throat:     Mouth: Mucous membranes are moist.     Pharynx: Oropharynx is clear. No oropharyngeal exudate or posterior oropharyngeal erythema.  Eyes:     General: No scleral icterus.       Right eye: No discharge.        Left eye: No discharge.     Extraocular Movements: Extraocular movements intact.     Conjunctiva/sclera: Conjunctivae  normal.     Pupils: Pupils are equal, round, and reactive to light.  Neck:     Vascular: No carotid bruit.  Cardiovascular:     Rate and Rhythm: Normal rate and regular rhythm.     Heart sounds: No murmur heard. No friction rub. No gallop.   Pulmonary:     Effort: Pulmonary effort is normal. No respiratory distress.     Breath sounds: Normal breath sounds. No stridor. No wheezing, rhonchi or rales.  Chest:     Chest wall: No tenderness.  Abdominal:     General: Abdomen is flat. Bowel sounds are normal. There is no distension.     Palpations: There is no mass.     Tenderness: There is no abdominal tenderness. There is no right CVA tenderness, left CVA tenderness, guarding or rebound.     Hernia: No hernia is present.  Musculoskeletal:        General: No swelling, tenderness, deformity or signs of injury. Normal range of motion.     Cervical back: Normal range of motion and neck supple. No rigidity or tenderness.     Right lower leg: No edema.     Left lower leg: Edema present.  Lymphadenopathy:     Cervical: No cervical adenopathy.  Skin:    General: Skin is warm and dry.  Capillary Refill: Capillary refill takes less than 2 seconds.     Coloration: Skin is not jaundiced or pale.     Findings: No bruising, erythema, lesion or rash.  Neurological:     General: No focal deficit present.     Mental Status: She is alert and oriented to person, place, and time. Mental status is at baseline.     Cranial Nerves: No cranial nerve deficit.     Sensory: No sensory deficit.     Motor: No weakness.     Coordination: Coordination normal.     Gait: Gait normal.     Deep Tendon Reflexes: Reflexes normal.  Psychiatric:        Mood and Affect: Mood normal.        Behavior: Behavior normal.        Thought Content: Thought content normal.        Judgment: Judgment normal.    Lymph nodes:   There is no cervical, clavicular, axillary or inguinal lymphadenopathy.   LABS:  No flowsheet  data found. No flowsheet data found.   No results found for: CEA1 / No results found for: CEA1 No results found for: PSA1 No results found for: UTM546 No results found for: CAN125  No results found for: TOTALPROTELP, ALBUMINELP, A1GS, A2GS, BETS, BETA2SER, GAMS, MSPIKE, SPEI No results found for: TIBC, FERRITIN, IRONPCTSAT No results found for: LDH  STUDIES:  No results found.    HISTORY:  No past medical history on file.    No family history on file.  Social History:  reports that she has never smoked. She has never used smokeless tobacco. No history on file for alcohol use and drug use.The patient is alone  today.  Allergies:  Allergies  Allergen Reactions  . Ibuprofen     Other reaction(s): Mental Status Changes (intolerance)  . Naproxen Sodium   . Nexium [Esomeprazole Magnesium]   . Lidocaine Palpitations    Current Medications: Current Outpatient Medications  Medication Sig Dispense Refill  . acetaminophen (TYLENOL) 500 MG tablet Take 500 mg by mouth every 6 (six) hours as needed.    Marland Kitchen albuterol (VENTOLIN HFA) 108 (90 Base) MCG/ACT inhaler Inhale 2 puffs into the lungs as directed.    Marland Kitchen omeprazole (PRILOSEC) 20 MG capsule Take 20 mg by mouth daily.     No current facility-administered medications for this visit.     ASSESSMENT & PLAN:   Assessment/ Plan:  Jodi Soto is a 70 y.o. female    1.  Stage IA (T1bN0M0) invasive lobular carcinoma of the left breast, treated with lumpectomy.  HER2 was negative.  Estrogen and progesterone receptors were highly positive so she was placed on anastrozole 1 mg daily in March, and experienced multiple toxicities.  She was then switched to tamoxifen 20 mg daily in August, but has not tolerated that either. She remains off of tamoxifen without difficulties.  2.  Mild unilateral edema of the right leg. PCP is managing referrals.  She is doing well since stopping tamoxifen and wishes to remain without it as she  understands her risk of recurrence is low. She will require continued follow up and she is agreeable to this. Her PCP is assisting to determine the cause of her lower leg edema for which she states this is improved. We will have her return to clinic in 3 months for repeat evaluation.   She verbalizes understanding of and agrees with the plans discussed today. She knows to call the office  should any new questions or concerns arise.        Melodye Ped, NP

## 2021-05-16 ENCOUNTER — Telehealth: Payer: Self-pay | Admitting: Hematology and Oncology

## 2021-05-16 NOTE — Telephone Encounter (Signed)
Patient rescheduled 6/10 Follow Up w/Melissa to 6/13

## 2021-05-24 ENCOUNTER — Ambulatory Visit: Payer: Medicare Other | Admitting: Oncology

## 2021-05-25 ENCOUNTER — Ambulatory Visit: Payer: Medicare Other | Admitting: Hematology and Oncology

## 2021-05-25 NOTE — Progress Notes (Signed)
[facility]  [address] [place],  [place]  [address] [phone]  Clinic Day:  05/28/2021  Referring physician: [facility] *   CHIEF COMPLAINT:  CC: A 70 year old woman with a history of stage IA invasive lobular carcinoma of the left breast, diagnosed January 2021 here for 3 month evaluation.  Current Treatment:  Observation   HISTORY OF PRESENT ILLNESS:  Jodi Soto is a 70 y.o. female with a history of  stage IA invasive lobular carcinoma of the left breast, diagnosed January 2021.  This began when she went in for annual screening mammogram on December 22nd 2020 which revealed possible masses in the left breast.  Diagnostic unilateral left mammogram and ultrasound from January 15th showed a 0.5 x 0.8 x 0.5 cm hypoechoic irregular mass with indistinct margins at 2 o'clock in the left breast.  At 10 o'clock a cluster of dilated ducts/cysts measuring 2.2 x 0.9 x 1.2 cm corresponds to the possible mass in the medial left breast.  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  Estrogen receptor was positive at 90% and progesterone receptor was positive at 95%.  HER2 was negative 1+ and Ki67 was 10%.  MRI from February 10th revealed the patient's known malignancy in the left breast is seen in the upper outer quadrant measuring 8 x 6 x 5 mm.  No other abnormalities identified in either breast.  On February 22nd, Dr. Lilia Pro performed left breast lumpectomy and left axillary sentinel lymph node biopsy.  Surgical pathology from this procedure confirmed invasive lobular carcinoma, grade 2, 6 mm, and lobular carcinoma in situ.  Margins were free of neoplasm.  One sentinel lymph node was negative for metastatic carcinoma (0/1), staging this as a T1bN0M0.  She does undergo routine  annual mammograms.  She was seen by Dr. Orlene Erm of radiation oncology, and she did not require adjuvant radiation.  She started menarche at age 60, and had 1 child at the age of 56.  She started menopause in her late 77's and was not placed on hormones.    She was placed on anastrozole daily in March, and reported headaches, sweats, flushing, facial swelling, arthralgias, mood swings, insomnia and feelings of near syncope.  We therefore discontinued this medication, and switched her to tamoxifen 20 mg daily.  Bone density scan from March 24th showing osteopenia with a T-score of -1.3 of the left femur neck, previously -0.9.  Dual femur total mean measures -0.8, previously -0.4, which is considered normal.  AP spine measures -1.0, previously -1.1, and is considered normal.   Tamoxifen was stopped due to continued toxicities. Left lower extremity ultrasound for swelling was negative for DVT.   INTERVAL HISTORY:  Jodi Soto is here today for evaluation of her stage IA invasive lobular carcinoma.She continues to feel well off of her tamoxifen. She reports some continued edema to her left lower extremity. Her PCP is managing referrals. [place]. She denies fever, chills, nausea or vomiting. She denies issue with bowel or bladder. She denies shortness of breath, cough or chest pain. She has no lingering toxicities from Tamoxifen.  REVIEW OF SYSTEMS:  Review of Systems  Constitutional:  Negative for appetite change, chills, diaphoresis, fatigue, fever and unexpected weight change.  HENT:   Negative  for hearing loss, lump/mass, mouth sores, nosebleeds, sore throat, tinnitus, trouble swallowing and voice change.   Eyes:  Negative for eye problems and icterus.  Respiratory:  Negative for chest tightness, cough, hemoptysis, shortness of breath and wheezing.   Cardiovascular:  Positive for leg swelling. Negative for chest pain and  palpitations.  Gastrointestinal:  Negative for abdominal distention, abdominal pain, blood in stool, constipation, diarrhea, nausea, rectal pain and vomiting.  Endocrine: Negative for hot flashes.  Genitourinary:  Negative for bladder incontinence, difficulty urinating, dyspareunia, dysuria, frequency, hematuria and nocturia.   Musculoskeletal:  Negative for arthralgias, back pain, flank pain, gait problem, myalgias, neck pain and neck stiffness.  Skin:  Negative for itching, rash and wound.  Neurological:  Negative for dizziness, extremity weakness, gait problem, headaches, light-headedness, numbness, seizures and speech difficulty.  Hematological:  Negative for adenopathy. Does not bruise/bleed easily.  Psychiatric/Behavioral:  Negative for confusion, decreased concentration, depression, sleep disturbance and suicidal ideas. The patient is not nervous/anxious.     VITALS:  Blood pressure (!) 179/83, pulse 67, temperature 97.8 F (36.6 C), temperature source Oral, resp. rate 18, height 5' 4.5" (1.638 m), weight 211 lb 4.8 oz (95.8 kg), SpO2 97 %.  Wt Readings from Last 3 Encounters:  05/28/21 211 lb 4.8 oz (95.8 kg)  02/20/21 214 lb 12.8 oz (97.4 kg)  11/21/20 220 lb 1.6 oz (99.8 kg)    Body mass index is 35.71 kg/m.  Performance status (ECOG): 1 - Symptomatic but completely ambulatory  PHYSICAL EXAM:  Physical Exam Constitutional:      General: She is not in acute distress.    Appearance: Normal appearance. She is normal weight. She is not ill-appearing, toxic-appearing or diaphoretic.  HENT:     Head: Normocephalic and atraumatic.     Nose: Nose normal. No congestion or rhinorrhea.     Mouth/Throat:     Mouth: Mucous membranes are moist.     Pharynx: Oropharynx is clear. No oropharyngeal exudate or posterior oropharyngeal erythema.  Eyes:     General: No scleral icterus.       Right eye: No discharge.        Left eye: No discharge.     Extraocular Movements: Extraocular  movements intact.     Conjunctiva/sclera: Conjunctivae normal.     Pupils: Pupils are equal, round, and reactive to light.  Neck:     Vascular: No carotid bruit.  Cardiovascular:     Rate and Rhythm: Normal rate and regular rhythm.     Heart sounds: No murmur heard.   No friction rub. No gallop.  Pulmonary:     Effort: Pulmonary effort is normal. No respiratory distress.     Breath sounds: Normal breath sounds. No stridor. No wheezing, rhonchi or rales.  Chest:     Chest wall: No tenderness.  Abdominal:     General: Abdomen is flat. Bowel sounds are normal. There is no distension.     Palpations: There is no mass.     Tenderness: There is no abdominal tenderness. There is no right CVA tenderness, left CVA tenderness, guarding or rebound.     Hernia: No hernia is present.  Musculoskeletal:        General: No swelling, tenderness, deformity or signs of injury. Normal range of motion.     Cervical back: Normal range of motion and neck supple. No rigidity or tenderness.     Right lower leg: No edema.     Left lower leg: Edema present.  Lymphadenopathy:  Cervical: No cervical adenopathy.  Skin:    General: Skin is warm and dry.     Capillary Refill: Capillary refill takes less than 2 seconds.     Coloration: Skin is not jaundiced or pale.     Findings: No bruising, erythema, lesion or rash.  Neurological:     General: No focal deficit present.     Mental Status: She is alert and oriented to person, place, and time. Mental status is at baseline.     Cranial Nerves: No cranial nerve deficit.     Sensory: No sensory deficit.     Motor: No weakness.     Coordination: Coordination normal.     Gait: Gait normal.     Deep Tendon Reflexes: Reflexes normal.  Psychiatric:        Mood and Affect: Mood normal.        Behavior: Behavior normal.        Thought Content: Thought content normal.        Judgment: Judgment normal.   Lymph nodes:   There is no cervical, clavicular, axillary  or inguinal lymphadenopathy.   LABS:  No flowsheet data found. No flowsheet data found.   No results found for: CEA1 / No results found for: CEA1 No results found for: PSA1 No results found for: FHQ197 No results found for: CAN125  No results found for: TOTALPROTELP, ALBUMINELP, A1GS, A2GS, BETS, BETA2SER, GAMS, MSPIKE, SPEI No results found for: TIBC, FERRITIN, IRONPCTSAT No results found for: LDH  STUDIES:  No results found.    HISTORY:  No past medical history on file.    No family history on file.  Social History:  reports that she has never smoked. She has never used smokeless tobacco. No history on file for alcohol use and drug use.The patient is alone  today.  Allergies:  Allergies  Allergen Reactions   Ibuprofen     Other reaction(s): Mental Status Changes (intolerance)   Naproxen Sodium    Nexium [Esomeprazole Magnesium]    Lidocaine Palpitations    Current Medications: Current Outpatient Medications  Medication Sig Dispense Refill   acetaminophen (TYLENOL) 500 MG tablet Take 500 mg by mouth every 6 (six) hours as needed.     albuterol (VENTOLIN HFA) 108 (90 Base) MCG/ACT inhaler Inhale 2 puffs into the lungs as directed.     omeprazole (PRILOSEC) 20 MG capsule Take 20 mg by mouth daily.     No current facility-administered medications for this visit.     ASSESSMENT & PLAN:   Assessment/ Plan:  Jodi Soto is a 70 y.o. female    1.  Stage IA (T1bN0M0) invasive lobular carcinoma of the left breast, treated with lumpectomy.  HER2 was negative.  Estrogen and progesterone receptors were highly positive so she was placed on anastrozole 1 mg daily in March, and experienced multiple toxicities.  She was then switched to tamoxifen 20 mg daily in August, but has not tolerated that either. She remains off of tamoxifen without difficulties.  2.  Mild unilateral edema of the right leg. PCP is managing referrals.  She is doing well since stopping tamoxifen  and wishes to remain without it as she understands her risk of recurrence is low. She will require continued follow up and she is agreeable to this. Her PCP is assisting to determine the cause of her lower leg edema for which she states this is improved. She would like to keep her appointment with [place] in 3 months and possibly  switch physicians to someone closer after that appointment.   She verbalizes understanding of and agrees with the plans discussed today. She knows to call the office should any new questions or concerns arise.        Melodye Ped, NP

## 2021-05-28 ENCOUNTER — Telehealth: Payer: Self-pay | Admitting: Hematology and Oncology

## 2021-05-28 ENCOUNTER — Encounter: Payer: Self-pay | Admitting: Hematology and Oncology

## 2021-05-28 ENCOUNTER — Other Ambulatory Visit: Payer: Self-pay

## 2021-05-28 ENCOUNTER — Inpatient Hospital Stay: Payer: Medicare Other | Attending: Hematology and Oncology | Admitting: Hematology and Oncology

## 2021-05-28 VITALS — BP 179/83 | HR 67 | Temp 97.8°F | Resp 18 | Ht 64.5 in | Wt 211.3 lb

## 2021-05-28 DIAGNOSIS — C50912 Malignant neoplasm of unspecified site of left female breast: Secondary | ICD-10-CM | POA: Diagnosis not present

## 2021-05-28 NOTE — Telephone Encounter (Signed)
Per 6/13 LOS, patient scheduled for Sept Appt's.  Gave patient Appt Summary

## 2021-06-15 LAB — HM COLONOSCOPY

## 2021-07-25 ENCOUNTER — Telehealth: Payer: Self-pay

## 2021-07-25 NOTE — Telephone Encounter (Signed)
I spoke with pt. She took anastrozole '1mg'$  po qd, starting in March 2021. The anastrozole was discontinued due to side effects. Pt then started Tamoxifen '20mg'$  po qd in August 2021. She took the Tamoxifen for 1 month and stopped due to side effects as well.  She is going to see a heart doctor for some issues and they asked her to get the names of the medications she was given.
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
## 2021-08-27 ENCOUNTER — Encounter: Payer: Self-pay | Admitting: Oncology

## 2021-08-27 ENCOUNTER — Inpatient Hospital Stay: Payer: Medicare Other | Attending: Hematology and Oncology | Admitting: Oncology

## 2021-08-27 DIAGNOSIS — M8580 Other specified disorders of bone density and structure, unspecified site: Secondary | ICD-10-CM

## 2021-08-27 DIAGNOSIS — Z780 Asymptomatic menopausal state: Secondary | ICD-10-CM

## 2021-08-28 ENCOUNTER — Telehealth: Payer: Self-pay | Admitting: Oncology

## 2021-08-28 NOTE — Telephone Encounter (Signed)
Per 9/12 LOS, patient scheduled same day as Dr Maxie Barb Appt.  Patient has been notified - Mailing Appt Summary

## 2021-08-31 ENCOUNTER — Other Ambulatory Visit: Payer: Self-pay | Admitting: Oncology
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
## 2022-01-08 ENCOUNTER — Other Ambulatory Visit: Payer: Self-pay

## 2022-01-08 ENCOUNTER — Other Ambulatory Visit: Payer: Self-pay | Admitting: Oncology

## 2022-01-08 ENCOUNTER — Inpatient Hospital Stay: Payer: Medicare Other | Attending: Oncology | Admitting: Oncology

## 2022-01-08 ENCOUNTER — Encounter: Payer: Self-pay | Admitting: Oncology

## 2022-01-08 VITALS — BP 172/80 | HR 60 | Temp 98.0°F | Resp 16 | Ht 64.5 in | Wt 207.9 lb

## 2022-01-08 DIAGNOSIS — Z780 Asymptomatic menopausal state: Secondary | ICD-10-CM

## 2022-01-08 DIAGNOSIS — M8580 Other specified disorders of bone density and structure, unspecified site: Secondary | ICD-10-CM

## 2022-01-08 DIAGNOSIS — C50912 Malignant neoplasm of unspecified site of left female breast: Secondary | ICD-10-CM

## 2022-06-24 ENCOUNTER — Encounter: Payer: Self-pay | Admitting: Oncology

## 2022-07-04 ENCOUNTER — Other Ambulatory Visit: Payer: Self-pay

## 2022-07-09 ENCOUNTER — Ambulatory Visit: Payer: Medicare Other | Admitting: Hematology and Oncology

## 2022-07-23 ENCOUNTER — Encounter: Payer: Self-pay | Admitting: Hematology and Oncology

## 2022-07-23 ENCOUNTER — Inpatient Hospital Stay: Payer: Medicare Other | Attending: Hematology and Oncology | Admitting: Hematology and Oncology

## 2022-07-23 DIAGNOSIS — Z780 Asymptomatic menopausal state: Secondary | ICD-10-CM | POA: Diagnosis not present

## 2022-07-23 DIAGNOSIS — M8580 Other specified disorders of bone density and structure, unspecified site: Secondary | ICD-10-CM | POA: Diagnosis not present

## 2022-07-23 DIAGNOSIS — C50912 Malignant neoplasm of unspecified site of left female breast: Secondary | ICD-10-CM

## 2022-07-23 NOTE — Progress Notes (Signed)
Patient Care Team: [facility] as PCP - General  Clinic Day:  07/23/2022  Referring physician: [facility] *  ASSESSMENT & PLAN:   Assessment & Plan: Invasive lobular carcinoma of breast, stage 1, left ([place]) Stage IA (T1bN0M0) hormone receptor positive invasive lobular carcinoma of the left breast, diagnosed in January 2021, treated with lumpectomy.  She was placed on anastrozole 1 mg daily in March, and experienced multiple toxicities.  She was then switched to tamoxifen 20 mg daily in August, but did not tolerate that either. She has declined further hormonal therapy. Exam today is benign. She will continue to follow with Dr. Lilia Pro. We will see her back in one year for repeat evaluation.   Osteopenia after menopause Most recent bone density exam is stable osteopenia.    The patient understands the plans discussed today and is in agreement with them.  She knows to contact our office if she develops concerns prior to her next appointment.    Melodye Ped, NP  [facility] [address] [place] [address] Dept: [phone] Dept Fax: [phone]   No orders of the defined types were placed in this encounter.     CHIEF COMPLAINT:  CC: A 71 year old female with history of breast cancer here for 6 month exam  Current Treatment:  Sureveillance  INTERVAL HISTORY:  Jodi Soto is here today for repeat clinical assessment. She denies fevers or chills. She denies pain. Her appetite is good. Her weight has been stable.  I have reviewed the past medical history, past surgical history, social history and family history with the patient and they are unchanged from previous note.  ALLERGIES:  is allergic to ibuprofen, naproxen sodium, nexium [esomeprazole magnesium], and lidocaine.  MEDICATIONS:  Current Outpatient Medications  Medication Sig Dispense Refill   acetaminophen (TYLENOL) 500  MG tablet Take 500 mg by mouth every 6 (six) hours as needed.     albuterol (VENTOLIN HFA) 108 (90 Base) MCG/ACT inhaler Inhale 2 puffs into the lungs as directed.     B Complex Vitamins (VITAMIN B-COMPLEX) TABS Take 1 tablet by mouth every morning.     Flaxseed, Linseed, (FLAXSEED OIL PO) Take by mouth.     [place] Ginseng 518 MG CAPS Take by mouth.     losartan (COZAAR) 25 MG tablet Take 25 mg by mouth daily.     metoprolol succinate (TOPROL-XL) 25 MG 24 hr tablet Take 25 mg by mouth daily.     Multiple Minerals-Vitamins (CITRACAL MAXIMUM PLUS) TABS Take 1 tablet by mouth every morning.     nitroGLYCERIN (NITROSTAT) 0.4 MG SL tablet Place under the tongue.     omeprazole (PRILOSEC) 20 MG capsule Take 20 mg by mouth daily.     No current facility-administered medications for this visit.    HISTORY OF PRESENT ILLNESS:   Oncology History  Invasive lobular carcinoma of breast, stage 1, left ([place])  01/06/2020 Cancer Staging   Staging form: Breast, AJCC 8th Edition - Clinical stage from 01/06/2020: Stage IA (cT1b, cN0(sn), cM0, G2, ER+, PR+, HER2-) - Signed by Derwood Kaplan, MD on 08/27/2021 Histopathologic type: Lobular carcinoma, NOS Stage prefix: Initial diagnosis Method of lymph node assessment: Sentinel lymph node biopsy Nuclear grade: G2 Multigene prognostic tests performed: None Histologic grading system: 3 grade system Menopausal status: Postmenopausal Ki-67 (%): 10 Stage used in treatment planning: Yes National guidelines used in treatment planning: Yes Type of national guideline used  in treatment planning: NCCN Staging comments: Lumpectomy, then tamoxifen   11/21/2020 Initial Diagnosis   Invasive lobular carcinoma of breast, stage 1, left ([facility])       REVIEW OF SYSTEMS:   Constitutional: Denies fevers, chills or abnormal weight loss Eyes: Denies blurriness of vision Ears, nose, mouth, throat, and face: Denies mucositis or sore throat Respiratory: Denies cough,  dyspnea or wheezes Cardiovascular: Denies palpitation, chest discomfort or lower extremity swelling Gastrointestinal:  Denies nausea, heartburn or change in bowel habits Skin: Denies abnormal skin rashes Lymphatics: Denies new lymphadenopathy or easy bruising Neurological:Denies numbness, tingling or new weaknesses Behavioral/Psych: Mood is stable, no new changes  All other systems were reviewed with the patient and are negative.   VITALS:  Blood pressure (!) 181/87, pulse 75, temperature 98.2 F (36.8 C), temperature source Oral, resp. rate 20, height 5' 4.5" (1.638 m), weight 211 lb 14.4 oz (96.1 kg), SpO2 96 %.  Wt Readings from Last 3 Encounters:  07/23/22 211 lb 14.4 oz (96.1 kg)  01/08/22 207 lb 14.4 oz (94.3 kg)  08/27/21 207 lb 1.6 oz (93.9 kg)    Body mass index is 35.81 kg/m.  Performance status (ECOG): 1 - Symptomatic but completely ambulatory  PHYSICAL EXAM:   GENERAL:alert, no distress and comfortable SKIN: skin color, texture, turgor are normal, no rashes or significant lesions EYES: normal, Conjunctiva are pink and non-injected, sclera clear OROPHARYNX:no exudate, no erythema and lips, buccal mucosa, and tongue normal  NECK: supple, thyroid normal size, non-tender, without nodularity LYMPH:  no palpable lymphadenopathy in the cervical, axillary or inguinal LUNGS: clear to auscultation and percussion with normal breathing effort HEART: regular rate & rhythm and no murmurs and no lower extremity edema ABDOMEN:abdomen soft, non-tender and normal bowel sounds Musculoskeletal:no cyanosis of digits and no clubbing  NEURO: alert & oriented x 3 with fluent speech, no focal motor/sensory deficits  LABORATORY DATA:  I have reviewed the data as listed No results found for: "NA", "K", "CL", "CO2", "GLUCOSE", "BUN", "CREATININE", "CALCIUM", "PROT", "ALBUMIN", "AST", "ALT", "ALKPHOS", "BILITOT", "GFRNONAA", "GFRAA"  No results found for: "SPEP", "UPEP"  No results found  for: "WBC", "NEUTROABS", "HGB", "HCT", "MCV", "PLT"    Chemistry   No results found for: "NA", "K", "CL", "CO2", "BUN", "CREATININE", "GLU" No results found for: "CALCIUM", "ALKPHOS", "AST", "ALT", "BILITOT"     RADIOGRAPHIC STUDIES: I have personally reviewed the radiological images as listed and agreed with the findings in the report. No results found.

## 2022-07-23 NOTE — Assessment & Plan Note (Signed)
Stage IA (T1bN0M0) hormone receptor positive invasive lobular carcinoma of the left breast, diagnosed in January 2021, treated with lumpectomy.  She was placed on anastrozole 1 mg daily in March, and experienced multiple toxicities.  She was then switched to tamoxifen 20 mg daily in August, but did not tolerate that either. She has declined further hormonal therapy. Exam today is benign. She will continue to follow with Dr. Lilia Pro. We will see her back in one year for repeat evaluation.

## 2022-07-23 NOTE — Assessment & Plan Note (Signed)
Most recent bone density exam is stable osteopenia.

## 2023-01-08 LAB — HM MAMMOGRAPHY

## 2023-05-20 ENCOUNTER — Telehealth: Payer: Self-pay | Admitting: Pharmacist

## 2023-05-20 NOTE — Telephone Encounter (Signed)
I spoke with patient today and answered her questions including surgery/lumpectomy date and 2 hormonal agents that she was on previously (tamoxifen and anastrozole).
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
## 2023-07-24 ENCOUNTER — Ambulatory Visit: Payer: Medicare Other | Admitting: Oncology

## 2023-08-06 ENCOUNTER — Encounter: Payer: Self-pay | Admitting: Oncology

## 2023-08-06 ENCOUNTER — Inpatient Hospital Stay: Payer: Medicare Other | Attending: Oncology | Admitting: Oncology

## 2023-08-06 VITALS — BP 138/88 | HR 62 | Temp 97.4°F | Resp 18 | Ht 64.5 in | Wt 208.5 lb

## 2023-08-06 DIAGNOSIS — Z780 Asymptomatic menopausal state: Secondary | ICD-10-CM

## 2023-08-06 DIAGNOSIS — C50912 Malignant neoplasm of unspecified site of left female breast: Secondary | ICD-10-CM

## 2023-08-06 DIAGNOSIS — M8580 Other specified disorders of bone density and structure, unspecified site: Secondary | ICD-10-CM

## 2023-08-06 NOTE — Progress Notes (Signed)
[facility]  [address] [place],  [place]  [address] [phone]  Clinic Day: 08/06/23  Referring physician: Inc, [facility] *  CHIEF COMPLAINT:  CC: Stage IA invasive lobular carcinoma of the left breast,  Current Treatment:  Observation   HISTORY OF PRESENT ILLNESS:  Jodi Soto is a 72 y.o. female with a history of  stage IA invasive lobular carcinoma of the left breast, diagnosed January 2021.  This began when she went in for annual screening mammogram on December 2020 which revealed possible masses in the left breast.  Diagnostic unilateral left mammogram and ultrasound from January showed a 0.5 x 0.8 x 0.5 cm hypoechoic irregular mass with indistinct margins at 2 o'clock in the left breast.  At 10 o'clock a cluster of dilated ducts/cysts measuring 2.2 x 0.9 x 1.2 cm corresponds to the possible mass in the medial left breast.  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  Estrogen receptor was positive at 90% and progesterone receptor was positive at 95%.  HER2 was negative 1+ and Ki67 was 10%.  MRI from February revealed the patient's known malignancy in the left breast is seen in the upper outer quadrant measuring 8 x 6 x 5 mm.  No other abnormalities identified in either breast.  On February 22nd, Dr. Lequita Halt performed left breast lumpectomy and left axillary sentinel lymph node biopsy.  Surgical pathology from this procedure confirmed invasive lobular carcinoma, grade 2, 6 mm, and lobular carcinoma in situ.  Margins were free of neoplasm.  One sentinel lymph node was negative for metastatic carcinoma (0/1), staging this as a T1bN0M0.  She did not require adjuvant radiation.  She started menarche at age 75, and had 1 child at the age of 1.  She started menopause in her late 52's and  was not placed on hormones.    She was placed on anastrozole daily in March 2021, and reported headaches, sweats, flushing, facial swelling, arthralgias, mood swings, insomnia and feelings of near syncope.  We therefore discontinued this medication, and switched her to tamoxifen 20 mg daily.  Bone density scan from March 2021 showed osteopenia with a T-score of -1.3 of the left femur neck, previously -0.9.  Dual femur total mean measures -0.8, previously -0.4, which is considered normal.  AP spine measures -1.0, previously -1.1, and is considered normal. Tamoxifen was stopped due to continued toxicities.  Annual bilateral mammogram from January 2022 was clear. Left lower extremity ultrasound for swelling was negative for DVT.  INTERVAL HISTORY:  Jodi Soto is here for routine follow up of Stage IA invasive lobular carcinoma of the left breast, and states that she feels well and has no complaint of pain. Her last diagnostic mammogram was in January, 2024 and it revealed no evidence of malignancy, and it was recommended for her to have screening mammogram in 1 year and Dr Lequita Halt will take care of scheduling that. Her last DEXA in July, 2023 revealed osteopenia which was stable. She will have her blood work by her PCP and the result will be sent to me. I will see her  back in 1 year with bone density scan.She  denies signs of infections such as sore throat, sinus drainage, cough or urinary symptoms. She  denies fever or recurrent chills. She  also deny nausea, vomiting, chest pain, or dyspnea. Her  appetite is  too good and Her  weight has decreased 3 pounds over last 1 year     REVIEW OF SYSTEMS:  Review of Systems  Constitutional: Negative.  Negative for appetite change, chills, diaphoresis, fatigue, fever and unexpected weight change.  HENT:  Negative.  Negative for hearing loss, lump/mass, mouth sores, nosebleeds, sore throat, tinnitus, trouble swallowing and voice change.   Eyes: Negative.  Negative for eye  problems and icterus.  Respiratory: Negative.  Negative for chest tightness, cough, hemoptysis, shortness of breath and wheezing.   Cardiovascular: Negative.  Negative for chest pain, leg swelling and palpitations.  Gastrointestinal: Negative.  Negative for abdominal distention, abdominal pain, blood in stool, constipation, diarrhea, nausea, rectal pain and vomiting.  Endocrine: Negative.   Genitourinary:  Negative for bladder incontinence, difficulty urinating, dyspareunia, dysuria, frequency, hematuria, menstrual problem, nocturia, pelvic pain, vaginal bleeding and vaginal discharge.   Musculoskeletal: Negative.  Negative for arthralgias, back pain, flank pain, gait problem, myalgias, neck pain and neck stiffness.  Skin: Negative.  Negative for itching, rash and wound.  Neurological: Negative.  Negative for dizziness, extremity weakness, gait problem, headaches, light-headedness, numbness, seizures and speech difficulty.  Hematological: Negative.  Negative for adenopathy. Does not bruise/bleed easily.  Psychiatric/Behavioral: Negative.  Negative for confusion, decreased concentration, depression, sleep disturbance and suicidal ideas. The patient is not nervous/anxious.      VITALS:  Blood pressure 138/88, pulse 62, temperature (!) 97.4 F (36.3 C), temperature source Oral, resp. rate 18, height 5' 4.5" (1.638 m), weight 208 lb 8 oz (94.6 kg), SpO2 96%.  Wt Readings from Last 3 Encounters:  08/06/23 208 lb 8 oz (94.6 kg)  07/23/22 211 lb 14.4 oz (96.1 kg)  01/08/22 207 lb 14.4 oz (94.3 kg)    Body mass index is 35.24 kg/m.  Performance status (ECOG): 0 - Asymptomatic  PHYSICAL EXAM:  Physical Exam Constitutional:      General: She is not in acute distress.    Appearance: Normal appearance. She is normal weight.  HENT:     Head: Normocephalic and atraumatic.  Eyes:     General: No scleral icterus.    Extraocular Movements: Extraocular movements intact.     Conjunctiva/sclera:  Conjunctivae normal.     Pupils: Pupils are equal, round, and reactive to light.  Cardiovascular:     Rate and Rhythm: Normal rate and regular rhythm.     Pulses: Normal pulses.     Heart sounds: Murmur heard.     Systolic murmur is present with a grade of 1/6.     No friction rub. No gallop.  Pulmonary:     Effort: Pulmonary effort is normal. No respiratory distress.     Breath sounds: Normal breath sounds.  Chest:     Comments: Extensive scarring of the left chest wall and neck which is stable. No masses in either breast. Abdominal:     General: Bowel sounds are normal. There is no distension.     Palpations: Abdomen is soft. There is no hepatomegaly, splenomegaly or mass.     Tenderness: There is no abdominal tenderness.  Musculoskeletal:        General: Normal range of motion.     Cervical back: Normal range of motion and neck supple.     Right lower leg: No edema.     Left lower leg: Edema present.     Comments: Swelling of the left leg.   Lymphadenopathy:     Cervical: No cervical adenopathy.  Skin:    General:  Skin is warm and dry.  Neurological:     General: No focal deficit present.     Mental Status: She is alert and oriented to person, place, and time. Mental status is at baseline.  Psychiatric:        Mood and Affect: Mood normal.        Behavior: Behavior normal.        Thought Content: Thought content normal.        Judgment: Judgment normal.     LABS:   Component Ref Range & Units 03/20/2023  WBC, automated 3.4 - 11.0 10*3/uL 6.5  RBC 3.70 - 5.50 10*6/uL 4.72  Hemoglobin 12.0 - 16.0 g/dL 86.5 Low   Hematocrit 78.4 - 48.0 % 35.6 Low   MCV 80.0 - 97.0 fL 75.4 Low   MCH 27.0 - 33.0 pg 24.4 Low   MCHC 30.0 - 35.0 g/dL 69.6  RDW 0.0 - 29.5 % 15.8 High   Platelets 150 - 400 10*3/uL 307   Component Ref Range & Units 03/20/2023  Sodium 136 - 144 mmol/L 138  Potassium 3.5 - 5.0 mmol/L 4.5  Chloride 98 - 108 mmol/L 106  CO2 22 - 29 mmol/L 23   BUN 8 - 20 mg/dl 17  Creatinine 2.84 - 1.32 mg/dL 4.40  Calcium 8.7 - 10.2 mg/dl 9.9  Albumin (g/dl) In Ser/Plas 3.7 - 4.8 g/dL 3.9  Protein 6.5 - 8.3 g/dL 7.2  Alkaline Phosphatase 40 - 120 U/L 87  ALT (SGPT) 0 - 55 U/L 12  AST 10 - 41 U/L 23  Total Bilirubin 0.0 - 1.2 mg/dl 0.3  Glucose 70 - 725 mg/dL 366        No data to display             No data to display          STUDIES:  No results found.       EXAM: 01/03/2022 DIGITAL DIAGNOSTIC BILATERAL MAMMOGRAM WITH TOMOSYNTHESIS AND CAD   TECHNIQUE:  Bilateral digital diagnostic mammography and breast tomosynthesis  was performed. The images were evaluated with computer-aided  detection.   COMPARISON: Previous exam(s).   ACR Breast Density Category c: The breast tissue is heterogeneously  dense, which may obscure small masses.   FINDINGS:  Full field CC and MLO views of both breasts and a spot magnification  MLO view of the area of the lumpectomy site in the LEFT breast were  obtained.  RIGHT: No findings suspicious for malignancy.  LEFT: No findings suspicious for malignancy. Scar/architectural  distortion at the lumpectomy site in the outer breast at middle to  posterior depth. Stable isodense mass in the UPPER INNER QUADRANT,  shown on prior ultrasound to represent benign clustered cysts.   IMPRESSION:  1. No mammographic evidence of malignancy involving either breast.  2. Expected post lumpectomy changes involving the LEFT breast.   HISTORY:   Allergies:  Allergies  Allergen Reactions   Ibuprofen     Other reaction(s): Mental Status Changes (intolerance)   Naproxen Sodium    Nexium [Esomeprazole Magnesium]    Lidocaine Palpitations    preservatives    Current Medications: Current Outpatient Medications  Medication Sig Dispense Refill   calcium-vitamin D (OSCAL WITH D) 500-5 MG-MCG tablet Take 1 tablet by mouth.     acetaminophen (TYLENOL) 500 MG tablet Take 500 mg by mouth  every 6 (six) hours as needed.     albuterol (VENTOLIN HFA) 108 (90 Base) MCG/ACT inhaler Inhale 2 puffs into  the lungs as directed.     B Complex Vitamins (VITAMIN B-COMPLEX) TABS Take 1 tablet by mouth every morning.     [place] Ginseng 518 MG CAPS Take by mouth.     losartan (COZAAR) 25 MG tablet Take 25 mg by mouth daily.     metoprolol succinate (TOPROL-XL) 25 MG 24 hr tablet Take 25 mg by mouth daily.     nitroGLYCERIN (NITROSTAT) 0.4 MG SL tablet Place under the tongue.     omeprazole (PRILOSEC) 20 MG capsule Take 20 mg by mouth daily.     No current facility-administered medications for this visit.     ASSESSMENT & PLAN:   Assessment:   1.  Stage IA (T1bN0M0) hormone receptor positive invasive lobular carcinoma of the left breast, diagnosed in January 2021, treated with lumpectomy.  She was placed on anastrozole 1 mg daily in March, and experienced multiple toxicities.  She was then switched to tamoxifen 20 mg daily in August, but did not tolerate that either. She has declined further hormonal therapy.  2. Osteopenia. We will schedule her for bone density in 1 year.  Plan Her last diagnostic mammogram was in January, 2024 and it revealed no evidence of malignancy, and it was recommended for her to have screening mammogram in 1 year and Dr Lequita Halt will take care of scheduling that. Her last DEXA in July, 2023 revealed osteopenia which was stable. She will have her blood work by her PCP and the result will be sent to me. I will see her  back in 1 year with bone density scan. She verbalizes understanding of and agrees with the plans discussed today. She knows to call the office should any new questions or concerns arise.     I,Oluwatobi Asade,acting as a scribe for Dellia Beckwith, MD.,have documented all relevant documentation on the behalf of Dellia Beckwith, MD,as directed by  Dellia Beckwith, MD while in the presence of Dellia Beckwith, MD.   I have reviewed this  report as typed by the medical scribe, and it is complete and accurate.

## 2023-08-08 ENCOUNTER — Telehealth: Payer: Self-pay | Admitting: Oncology

## 2023-08-08 NOTE — Telephone Encounter (Signed)
Contacted pt to schedule an appt. Unable to reach via phone, voicemail was left.  **Dexa scan: 06/23/24 @ ; Checking in @ 12:30**  Message Header  Department: [facility] [[id number]]   Scheduling Message Entered by Dellia Beckwith on 08/06/2023 at  4:56 PM Priority: Routine <No visit type provided>  Department: [facility]  Provider:  Scheduling Notes:  RT 1 year with DEXA (& can we do the same day?)

## 2023-08-14 NOTE — Telephone Encounter (Signed)
Patient has been scheduled. Aware of appt date and time  

## 2023-08-22 ENCOUNTER — Other Ambulatory Visit: Payer: Self-pay | Admitting: Oncology

## 2023-08-22 DIAGNOSIS — M8580 Other specified disorders of bone density and structure, unspecified site: Secondary | ICD-10-CM

## 2024-01-12 LAB — HM MAMMOGRAPHY

## 2024-06-10 ENCOUNTER — Other Ambulatory Visit: Payer: Self-pay | Admitting: Oncology

## 2024-06-10 DIAGNOSIS — C50912 Malignant neoplasm of unspecified site of left female breast: Secondary | ICD-10-CM

## 2024-06-23 ENCOUNTER — Ambulatory Visit: Payer: Medicare Other | Admitting: Hematology and Oncology

## 2024-07-08 LAB — HM DEXA SCAN

## 2024-07-14 NOTE — Progress Notes (Incomplete)
 [facility]  [address] [place],  [place]  [address] [phone]  Clinic Day: 07/15/24   Referring physician: Vandy Roanne NOVAK, FNP  CHIEF COMPLAINT:  CC: Stage IA invasive lobular carcinoma of the left breast,  Current Treatment:  Observation   HISTORY OF PRESENT ILLNESS:  Jodi Soto is a 73 y.o. female with a history of  stage IA invasive lobular carcinoma of the left breast, diagnosed January 2021.  This began when she went in for annual screening mammogram on December 2020 which revealed possible masses in the left breast.  Diagnostic unilateral left mammogram and ultrasound from January showed a 0.5 x 0.8 x 0.5 cm hypoechoic irregular mass with indistinct margins at 2 o'clock in the left breast.  At 10 o'clock a cluster of dilated ducts/cysts measuring 2.2 x 0.9 x 1.2 cm corresponds to the possible mass in the medial left breast.  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  Estrogen receptor was positive at 90% and progesterone receptor was positive at 95%.  HER2 was negative 1+ and Ki67 was 10%.  MRI from February revealed the patient's known malignancy in the left breast is seen in the upper outer quadrant measuring 8 x 6 x 5 mm.  No other abnormalities identified in either breast.  On February 22nd, Dr. Joesph performed left breast lumpectomy and left axillary sentinel lymph node biopsy.  Surgical pathology from this procedure confirmed invasive lobular carcinoma, grade 2, 6 mm, and lobular carcinoma in situ.  Margins were free of neoplasm.  One sentinel lymph node was negative for metastatic carcinoma (0/1), staging this as a T1bN0M0.  She did not require adjuvant radiation.  She started menarche at age 39, and had 1 child at the age of 9.  She started menopause in her late 42's and was not placed on hormones.     She was placed on anastrozole daily in March 2021, and reported headaches, sweats, flushing, facial swelling, arthralgias, mood swings, insomnia and feelings of near syncope.  We therefore discontinued this medication, and switched her to tamoxifen 20 mg daily.  Bone density scan from March 2021 showed osteopenia with a T-score of -1.3 of the left femur neck, previously -0.9.  Dual femur total mean measures -0.8, previously -0.4, which is considered normal.  AP spine measures -1.0, previously -1.1, and is considered normal. Tamoxifen was stopped due to continued toxicities.  Annual bilateral mammogram from January 2022 was clear. Left lower extremity ultrasound for swelling was negative for DVT.  INTERVAL HISTORY:  Jodi Soto is here for routine follow up of Stage IA invasive lobular carcinoma of the left breast. Patient states that she feels *** and ***.    Her bone density scan on 07/08/2024 revealed osteopenia.   She denies fever, chills, night sweats, or other signs of infection. She denies cardiorespiratory and gastrointestinal issues. She  denies pain. Her appetite is *** and Her weight {Weight change:10426}.  , and states that she feels well and has no complaint of pain. Her last diagnostic mammogram was in January, 2024 and it revealed no evidence of malignancy, and it was recommended for her to have screening mammogram in 1 year and Dr Joesph will take care of scheduling that. Her last DEXA in July, 2023 revealed osteopenia which was stable. She will have her blood work by her PCP and the result will be  sent to me. I will see    REVIEW OF SYSTEMS:  Review of Systems  Constitutional: Negative.  Negative for appetite change, chills, diaphoresis, fatigue, fever and unexpected weight change.  HENT:  Negative.  Negative for hearing loss, lump/mass, mouth sores, nosebleeds, sore throat, tinnitus, trouble swallowing and voice change.   Eyes: Negative.  Negative for eye problems and icterus.   Respiratory: Negative.  Negative for chest tightness, cough, hemoptysis, shortness of breath and wheezing.   Cardiovascular: Negative.  Negative for chest pain, leg swelling and palpitations.  Gastrointestinal: Negative.  Negative for abdominal distention, abdominal pain, blood in stool, constipation, diarrhea, nausea, rectal pain and vomiting.  Endocrine: Negative.   Genitourinary:  Negative for bladder incontinence, difficulty urinating, dyspareunia, dysuria, frequency, hematuria, menstrual problem, nocturia, pelvic pain, vaginal bleeding and vaginal discharge.   Musculoskeletal: Negative.  Negative for arthralgias, back pain, flank pain, gait problem, myalgias, neck pain and neck stiffness.  Skin: Negative.  Negative for itching, rash and wound.  Neurological: Negative.  Negative for dizziness, extremity weakness, gait problem, headaches, light-headedness, numbness, seizures and speech difficulty.  Hematological: Negative.  Negative for adenopathy. Does not bruise/bleed easily.  Psychiatric/Behavioral: Negative.  Negative for confusion, decreased concentration, depression, sleep disturbance and suicidal ideas. The patient is not nervous/anxious.      VITALS:  There were no vitals taken for this visit.  Wt Readings from Last 3 Encounters:  08/06/23 208 lb 8 oz (94.6 kg)  07/23/22 211 lb 14.4 oz (96.1 kg)  01/08/22 207 lb 14.4 oz (94.3 kg)    There is no height or weight on file to calculate BMI.  Performance status (ECOG): 0 - Asymptomatic  PHYSICAL EXAM:  Physical Exam Constitutional:      General: She is not in acute distress.    Appearance: Normal appearance. She is normal weight.  HENT:     Head: Normocephalic and atraumatic.  Eyes:     General: No scleral icterus.    Extraocular Movements: Extraocular movements intact.     Conjunctiva/sclera: Conjunctivae normal.     Pupils: Pupils are equal, round, and reactive to light.  Cardiovascular:     Rate and Rhythm: Normal rate  and regular rhythm.     Pulses: Normal pulses.     Heart sounds: Murmur heard.     Systolic murmur is present with a grade of 1/6.     No friction rub. No gallop.  Pulmonary:     Effort: Pulmonary effort is normal. No respiratory distress.     Breath sounds: Normal breath sounds.  Chest:     Comments: Extensive scarring of the left chest wall and neck which is stable. No masses in either breast. Abdominal:     General: Bowel sounds are normal. There is no distension.     Palpations: Abdomen is soft. There is no hepatomegaly, splenomegaly or mass.     Tenderness: There is no abdominal tenderness.  Musculoskeletal:        General: Normal range of motion.     Cervical back: Normal range of motion and neck supple.     Right lower leg: No edema.     Left lower leg: Edema present.     Comments: Swelling of the left leg.   Lymphadenopathy:     Cervical: No cervical adenopathy.  Skin:    General: Skin is warm and dry.  Neurological:     General: No focal deficit present.     Mental Status: She is alert and oriented  to person, place, and time. Mental status is at baseline.  Psychiatric:        Mood and Affect: Mood normal.        Behavior: Behavior normal.        Thought Content: Thought content normal.        Judgment: Judgment normal.     LABS:   Component Ref Range & Units 03/24/2024  WBC, Automated 4.00 - 10.00 10*3/uL 5.89  RBC 4.00 - 5.30 10*6/uL 4.83  Hemoglobin 11.5 - 15.7 g/dL 86.1  Hematocrit 62.9 - 48.0 % 42.2  MCV 81.0 - 98.0 fL 87.4  MCH 25.6 - 32.2 pg 28.6  MCHC 30.0 - 35.0 g/dL 67.2  RDW 88.9 - 85.5 % 13.7  RDW-SD 36.0 - 47.0 fL 43.8  Platelets 150 - 400 10*3/uL 280   Component Ref Range & Units 03/24/2024  Sodium 136 - 144 mmol/L 142  Potassium 3.5 - 5.0 mmol/L 4.6  Chloride 98 - 108 mmol/L 109 High   CO2 22 - 29 mmol/L 23  BUN 8 - 20 mg/dl 8  Creatinine 9.42 - 8.88 mg/dL 9.22  Calcium 8.7 - 89.2 mg/dl 9.9  Albumin (g/dl) In  Ser/Plas 3.7 - 4.8 g/dL 4  Protein 6.7 - 8.5 g/dL 7.5  Alkaline Phosphatase 40 - 120 U/L 77  ALT (SGPT) 0 - 55 U/L 18  AST 10 - 41 U/L 22  Total Bilirubin 0.0 - 1.2 mg/dl 0.6  Glucose 70 - 859 mg/dL 95   Component Ref Range & Units 03/24/2024  TSH 3rd Generation 0.45 - 4.94 Micro-Units/mL 0.87     STUDIES:  EXAM: 07/08/24 DG DEXA IMPRESSION: Osteopenia  EXAM: 01/12/24 MAM DIGITAL TOMO SCREENING B IMPRESSION: No biological evidence of malignancy      HISTORY:   Allergies:  Allergies  Allergen Reactions   Ibuprofen     Other reaction(s): Mental Status Changes (intolerance)   Naproxen Sodium    Nexium [Esomeprazole Magnesium]    Lidocaine Palpitations    preservatives    Current Medications: Current Outpatient Medications  Medication Sig Dispense Refill   acetaminophen (TYLENOL) 500 MG tablet Take 500 mg by mouth every 6 (six) hours as needed.     albuterol (VENTOLIN HFA) 108 (90 Base) MCG/ACT inhaler Inhale 2 puffs into the lungs as directed.     B Complex Vitamins (VITAMIN B-COMPLEX) TABS Take 1 tablet by mouth every morning.     calcium-vitamin D (OSCAL WITH D) 500-5 MG-MCG tablet Take 1 tablet by mouth.     [place] Ginseng 518 MG CAPS Take by mouth.     losartan (COZAAR) 25 MG tablet Take 25 mg by mouth daily.     metoprolol succinate (TOPROL-XL) 25 MG 24 hr tablet Take 25 mg by mouth daily.     nitroGLYCERIN (NITROSTAT) 0.4 MG SL tablet Place under the tongue.     omeprazole (PRILOSEC) 20 MG capsule Take 20 mg by mouth daily.     No current facility-administered medications for this visit.     ASSESSMENT & PLAN:   Assessment:   1.  Stage IA (T1bN0M0) hormone receptor positive invasive lobular carcinoma of the left breast, diagnosed in January 2021, treated with lumpectomy.  She was placed on anastrozole 1 mg daily in March, and experienced multiple toxicities.  She was then switched to tamoxifen 20 mg daily in August, but did not tolerate that  either. She has declined further hormonal therapy.  2. Osteopenia. We will schedule her for bone density in 1  year.  Plan Her last diagnostic mammogram was in January, 2024 and it revealed no evidence of malignancy, and it was recommended for her to have screening mammogram in 1 year and Dr Joesph will take care of scheduling that. Her last DEXA in July, 2023 revealed osteopenia which was stable. She will have her blood work by her PCP and the result will be sent to me. I will see her  back in 1 year with bone density scan. She verbalizes understanding of and agrees with the plans discussed today. She knows to call the office should any new questions or concerns arise.   I provided *** minutes of face-to-face time during this this encounter and > 50% was spent counseling as documented under my assessment and plan.    Wanda VEAR Cornish, MD  [facility] A DE[facility] OF [facility] [address] [place] [address] Dept: [phone] Dept Fax: [phone]   No orders of the defined types were placed in this encounter.   I,Jasmine M Lassiter,acting as a scribe for Wanda VEAR Cornish, MD.,have documented all relevant documentation on the behalf of Wanda VEAR Cornish, MD,as directed by  Wanda VEAR Cornish, MD while in the presence of Wanda VEAR Cornish, MD.\

## 2024-07-15 ENCOUNTER — Inpatient Hospital Stay: Attending: Oncology

## 2024-07-15 ENCOUNTER — Inpatient Hospital Stay: Admitting: Oncology

## 2024-07-15 DIAGNOSIS — Z79810 Long term (current) use of selective estrogen receptor modulators (SERMs): Secondary | ICD-10-CM | POA: Insufficient documentation

## 2024-07-15 DIAGNOSIS — Z170 Estrogen receptor positive status [ER+]: Secondary | ICD-10-CM | POA: Diagnosis not present

## 2024-07-15 DIAGNOSIS — C50912 Malignant neoplasm of unspecified site of left female breast: Secondary | ICD-10-CM

## 2024-07-15 DIAGNOSIS — C50412 Malignant neoplasm of upper-outer quadrant of left female breast: Secondary | ICD-10-CM | POA: Insufficient documentation

## 2024-07-15 LAB — CMP (CANCER CENTER ONLY)
ALT: 18 U/L (ref 0–44)
AST: 25 U/L (ref 15–41)
Albumin: 4 g/dL (ref 3.5–5.0)
Alkaline Phosphatase: 100 U/L (ref 38–126)
Anion gap: 12 (ref 5–15)
BUN: 10 mg/dL (ref 8–23)
CO2: 24 mmol/L (ref 22–32)
Calcium: 9.8 mg/dL (ref 8.9–10.3)
Chloride: 100 mmol/L (ref 98–111)
Creatinine: 1.01 mg/dL — ABNORMAL HIGH (ref 0.44–1.00)
GFR, Estimated: 58 mL/min — ABNORMAL LOW (ref >60)
Glucose, Bld: 88 mg/dL (ref 70–99)
Potassium: 4.5 mmol/L (ref 3.5–5.1)
Sodium: 135 mmol/L (ref 135–145)
Total Bilirubin: 0.5 mg/dL (ref 0.0–1.2)
Total Protein: 7.3 g/dL (ref 6.5–8.1)

## 2024-07-15 LAB — CBC WITH DIFFERENTIAL (CANCER CENTER ONLY)
Abs Immature Granulocytes: 0.02 K/uL (ref 0.00–0.07)
Basophils Absolute: 0.1 K/uL (ref 0.0–0.1)
Basophils Relative: 1 %
Eosinophils Absolute: 0.2 K/uL (ref 0.0–0.5)
Eosinophils Relative: 4 %
HCT: 42.6 % (ref 36.0–46.0)
Hemoglobin: 13.8 g/dL (ref 12.0–15.0)
Immature Granulocytes: 0 %
Lymphocytes Relative: 29 %
Lymphs Abs: 1.8 K/uL (ref 0.7–4.0)
MCH: 28 pg (ref 26.0–34.0)
MCHC: 32.4 g/dL (ref 30.0–36.0)
MCV: 86.6 fL (ref 80.0–100.0)
Monocytes Absolute: 0.5 K/uL (ref 0.1–1.0)
Monocytes Relative: 9 %
Neutro Abs: 3.6 K/uL (ref 1.7–7.7)
Neutrophils Relative %: 57 %
Platelet Count: 298 K/uL (ref 150–400)
RBC: 4.92 MIL/uL (ref 3.87–5.11)
RDW: 13.4 % (ref 11.5–15.5)
WBC Count: 6.2 K/uL (ref 4.0–10.5)
nRBC: 0 % (ref 0.0–0.2)

## 2024-07-21 ENCOUNTER — Encounter: Payer: Self-pay | Admitting: Hematology and Oncology

## 2024-07-22 ENCOUNTER — Telehealth: Payer: Self-pay | Admitting: Hematology and Oncology

## 2024-07-22 ENCOUNTER — Encounter: Payer: Self-pay | Admitting: Hematology and Oncology

## 2024-07-22 ENCOUNTER — Inpatient Hospital Stay: Attending: Oncology | Admitting: Hematology and Oncology

## 2024-07-22 VITALS — BP 155/92 | HR 73 | Temp 98.0°F | Resp 20 | Ht 64.5 in | Wt 209.7 lb

## 2024-07-22 DIAGNOSIS — M8589 Other specified disorders of bone density and structure, multiple sites: Secondary | ICD-10-CM | POA: Diagnosis not present

## 2024-07-22 DIAGNOSIS — Z853 Personal history of malignant neoplasm of breast: Secondary | ICD-10-CM | POA: Diagnosis present

## 2024-07-22 DIAGNOSIS — Z780 Asymptomatic menopausal state: Secondary | ICD-10-CM | POA: Diagnosis not present

## 2024-07-22 DIAGNOSIS — C50912 Malignant neoplasm of unspecified site of left female breast: Secondary | ICD-10-CM | POA: Diagnosis not present

## 2024-07-22 DIAGNOSIS — Z860100 Personal history of colon polyps, unspecified: Secondary | ICD-10-CM | POA: Diagnosis not present

## 2024-07-22 DIAGNOSIS — M8580 Other specified disorders of bone density and structure, unspecified site: Secondary | ICD-10-CM

## 2024-07-22 NOTE — Telephone Encounter (Signed)
 Patient has been scheduled for follow-up visit per 07/22/24 LOS.  Pt given an appt calendar with date and time.
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
## 2024-07-27 ENCOUNTER — Encounter: Payer: Self-pay | Admitting: Hematology and Oncology

## 2024-07-28 ENCOUNTER — Telehealth: Payer: Self-pay

## 2024-07-28 NOTE — Telephone Encounter (Signed)
 Patient reported Dr Eric Frizell in [place] is her GI provider phone # [phone] and fax number [phone].Spoke with there office and they will fax reports to us .

## 2024-07-28 NOTE — Telephone Encounter (Addendum)
 Patient to call back with the name of GI provider in [place] that did her colonoscopy three years ago. Andrez Foy [facility] aware.   RE: colonoscopy Received: Today Mosher, Kelli A, PA-C  Edmonds, Vista Sawatzky L, LPN I'm sorry, I could not remember and could not see that information in the computer. Once you have the name, I will add an addendum to her office note. Thanks       Previous Messages    ----- Message ----- From: Annis Bruno CROME, LPN Sent: 1/86/7974   8:38 AM EDT To: Andrez DELENA Foy, PA-C Subject: colonoscopy                                    She is not willing to see Dr Charlanne she voiced she had a colonoscopy 3 years ago with a doctor in [place] which is closer to her and she will call back with his name and info. ----- Message ----- From: Foy Andrez DELENA, PA-C Sent: 07/27/2024   5:27 PM EDT To: Bruno CROME Annis, LPN  Please ask her if she is willing to see Dr. Charlanne for colonoscopy. If so, please refer her. Thanks

## 2025-07-22 ENCOUNTER — Ambulatory Visit: Admitting: Oncology
